# Patient Record
Sex: Female | Born: 1990 | Race: Black or African American | Hispanic: No | Marital: Married | Smoking: Never smoker
Health system: Southern US, Community
[De-identification: ages and names within clinical notes are randomized; demographics above are authoritative.]

## PROBLEM LIST (undated history)

## (undated) DIAGNOSIS — R1011 Right upper quadrant pain: Secondary | ICD-10-CM

## (undated) DIAGNOSIS — I10 Essential (primary) hypertension: Secondary | ICD-10-CM

## (undated) DIAGNOSIS — E282 Polycystic ovarian syndrome: Secondary | ICD-10-CM

## (undated) DIAGNOSIS — F419 Anxiety disorder, unspecified: Secondary | ICD-10-CM

## (undated) DIAGNOSIS — K589 Irritable bowel syndrome without diarrhea: Secondary | ICD-10-CM

## (undated) DIAGNOSIS — K8020 Calculus of gallbladder without cholecystitis without obstruction: Secondary | ICD-10-CM

## (undated) HISTORY — DX: Calculus of gallbladder without cholecystitis without obstruction: K80.20

## (undated) HISTORY — PX: NO PAST SURGERIES: SHX2092

## (undated) HISTORY — DX: Polycystic ovarian syndrome: E28.2

## (undated) HISTORY — DX: Right upper quadrant pain: R10.11

## (undated) HISTORY — DX: Essential (primary) hypertension: I10

---
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
## 2009-03-24 ENCOUNTER — Ambulatory Visit: Payer: Self-pay | Admitting: Family Medicine

## 2009-04-27 ENCOUNTER — Ambulatory Visit: Payer: Self-pay | Admitting: Family Medicine

## 2009-04-27 ENCOUNTER — Encounter (INDEPENDENT_AMBULATORY_CARE_PROVIDER_SITE_OTHER): Payer: Self-pay | Admitting: Internal Medicine

## 2016-10-10 DIAGNOSIS — K589 Irritable bowel syndrome without diarrhea: Secondary | ICD-10-CM

## 2016-10-10 HISTORY — DX: Irritable bowel syndrome, unspecified: K58.9

## 2016-12-23 ENCOUNTER — Encounter: Payer: Self-pay | Admitting: Emergency Medicine

## 2016-12-23 ENCOUNTER — Emergency Department
Admission: EM | Admit: 2016-12-23 | Discharge: 2016-12-23 | Disposition: A | Discharge status: Home / Self Care | Payer: Commercial Managed Care - HMO | Attending: Emergency Medicine | Admitting: Emergency Medicine

## 2016-12-23 ENCOUNTER — Emergency Department: Payer: Commercial Managed Care - HMO

## 2016-12-23 DIAGNOSIS — R1011 Right upper quadrant pain: Secondary | ICD-10-CM | POA: Diagnosis present

## 2016-12-23 DIAGNOSIS — K8020 Calculus of gallbladder without cholecystitis without obstruction: Secondary | ICD-10-CM | POA: Insufficient documentation

## 2016-12-23 DIAGNOSIS — K8080 Other cholelithiasis without obstruction: Secondary | ICD-10-CM

## 2016-12-23 LAB — CBC
HCT: 38.5 % (ref 35.0–47.0)
HEMOGLOBIN: 13.2 g/dL (ref 12.0–16.0)
MCH: 30.1 pg (ref 26.0–34.0)
MCHC: 34.2 g/dL (ref 32.0–36.0)
MCV: 87.8 fL (ref 80.0–100.0)
PLATELETS: 415 10*3/uL (ref 150–440)
RBC: 4.38 MIL/uL (ref 3.80–5.20)
RDW: 14.4 % (ref 11.5–14.5)
WBC: 9.7 10*3/uL (ref 3.6–11.0)

## 2016-12-23 LAB — COMPREHENSIVE METABOLIC PANEL
ALK PHOS: 56 U/L (ref 38–126)
ALT: 26 U/L (ref 14–54)
ANION GAP: 6 (ref 5–15)
AST: 25 U/L (ref 15–41)
Albumin: 4.5 g/dL (ref 3.5–5.0)
BILIRUBIN TOTAL: 0.5 mg/dL (ref 0.3–1.2)
BUN: 16 mg/dL (ref 6–20)
CO2: 28 mmol/L (ref 22–32)
CREATININE: 0.63 mg/dL (ref 0.44–1.00)
Calcium: 9.4 mg/dL (ref 8.9–10.3)
Chloride: 101 mmol/L (ref 101–111)
GFR calc non Af Amer: >60 mL/min (ref >60)
Glucose, Bld: 114 mg/dL — ABNORMAL HIGH (ref 65–99)
Potassium: 3.7 mmol/L (ref 3.5–5.1)
SODIUM: 135 mmol/L (ref 135–145)
Total Protein: 8.4 g/dL — ABNORMAL HIGH (ref 6.5–8.1)

## 2016-12-23 LAB — LIPASE, BLOOD: Lipase: 24 U/L (ref 11–51)

## 2016-12-23 MED ORDER — MORPHINE SULFATE (PF) 2 MG/ML IV SOLN
2.0000 mg | Freq: Once | INTRAVENOUS | Status: AC
  Administered 2016-12-23: 2 mg via INTRAVENOUS
  Filled 2016-12-23: qty 1

## 2016-12-23 MED ORDER — ONDANSETRON HCL 4 MG/2ML IJ SOLN
4.0000 mg | Freq: Once | INTRAMUSCULAR | Status: AC
  Administered 2016-12-23: 4 mg via INTRAVENOUS
  Filled 2016-12-23: qty 2

## 2016-12-23 MED ORDER — OXYCODONE-ACETAMINOPHEN 5-325 MG PO TABS: 1.0000 | ORAL_TABLET | ORAL | 0 refills | Status: DC | PRN

## 2016-12-23 MED ORDER — ONDANSETRON 4 MG PO TBDP: 4.0000 mg | ORAL_TABLET | Freq: Three times a day (TID) | ORAL | 0 refills | Status: DC | PRN

## 2016-12-23 NOTE — ED Notes (Signed)
Pt going to [facility] 

## 2016-12-23 NOTE — ED Triage Notes (Signed)
Pt ambulatory to tx room, reports epigastric pain starting last night but pain has moved more to RUQ.  Reports happened in past and BM helped, but no relief this time.  PT reports nausea as well, denies vomiting and diarrhea.

## 2016-12-23 NOTE — ED Provider Notes (Signed)
[facility] Emergency Department Provider Note    First MD Initiated Contact with Patient 12/23/16 [phone]     (approximate)  I have reviewed the triage vital signs and the nursing notes.   HISTORY  Chief Complaint Abdominal Pain   HPI Briana Barber is a 26 y.o. female presents with acute onset of right upper quadrant abdominal pain associated with nausea tonight. Patient states her current pain score is 8 out of 10. Patient denies any aggravating or alleviating factors. Patient states that she did have an episode yesterday that was similar however resolved following a bowel movement.   Past medical history None There are no active problems to display for this patient.   Surgical history None  Prior to Admission medications   Medication Sig Start Date End Date Taking? Authorizing Provider  ondansetron (ZOFRAN ODT) 4 MG disintegrating tablet Take 1 tablet (4 mg total) by mouth every 8 (eight) hours as needed for nausea or vomiting. 12/23/16   Darci Current, MD  oxyCODONE-acetaminophen (ROXICET) 5-325 MG tablet Take 1 tablet by mouth every 4 (four) hours as needed for severe pain. 12/23/16   Darci Current, MD    Allergies No known drug allergies History reviewed. No pertinent family history.  Social History Social History  Substance Use Topics  . Smoking status: Never Smoker  . Smokeless tobacco: Never Used  . Alcohol use No    Review of Systems Constitutional: No fever/chills Eyes: No visual changes. ENT: No sore throat. Cardiovascular: Denies chest pain. Respiratory: Denies shortness of breath. Gastrointestinal: Positive for abdominal pain and nausea  Genitourinary: Negative for dysuria. Musculoskeletal: Negative for back pain. Skin: Negative for rash. Neurological: Negative for headaches, focal weakness or numbness.  10-point ROS otherwise negative.  ____________________________________________   PHYSICAL EXAM:  VITAL  SIGNS: ED Triage Vitals  Enc Vitals Group     BP 12/23/16 0541 (!) 162/94     Pulse Rate 12/23/16 0541 (!) 125     Resp 12/23/16 0541 18     Temp 12/23/16 0541 98 F (36.7 C)     Temp Source 12/23/16 0541 Oral     SpO2 12/23/16 0541 98 %     Weight 12/23/16 0539 200 lb (90.7 kg)     Height 12/23/16 0539 5\' 4"  (1.626 m)     Head Circumference --      Peak Flow --      Pain Score 12/23/16 0540 7     Pain Loc --      Pain Edu? --      Excl. in GC? --     Constitutional: Alert and oriented. Well appearing and in no acute distress. Eyes: Conjunctivae are normal. PERRL. EOMI. Head: Atraumatic. Mouth/Throat: Mucous membranes are moist. Oropharynx non-erythematous. Neck: No stridor.   Cardiovascular: Normal rate, regular rhythm. Good peripheral circulation. Grossly normal heart sounds. Respiratory: Normal respiratory effort.  No retractions. Lungs CTAB. Gastrointestinal: Right upper quadrant tenderness to palpation. No distention.  Musculoskeletal: No lower extremity tenderness nor edema. No gross deformities of extremities. Neurologic:  Normal speech and language. No gross focal neurologic deficits are appreciated.  Skin:  Skin is warm, dry and intact. No rash noted. Psychiatric: Mood and affect are normal. Speech and behavior are normal.  ____________________________________________   LABS (all labs ordered are listed, but only abnormal results are displayed)  Labs Reviewed  COMPREHENSIVE METABOLIC PANEL - Abnormal; Notable for the following:       Result Value  Glucose, Bld 114 (*)    Total Protein 8.4 (*)    All other components within normal limits  LIPASE, BLOOD  CBC  URINALYSIS, COMPLETE (UACMP) WITH MICROSCOPIC   ____________________________________________  EKG  ED ECG REPORT I, Pella N BROWN, the attending physician, personally viewed and interpreted this ECG.   Date: 12/23/2016  EKG Time: 5:45 AM  Rate: 113  Rhythm: Sinus tachycardia  Axis: Normal   Intervals: Normal  ST&T Change: None  ____________________________________________   RADIOLOGY I, [place] N BROWN, personally viewed and evaluated these images (plain radiographs) as part of my medical decision making, as well as reviewing the written report by the radiologist.  [place] Abdomen Limited Ruq  Result Date: 12/23/2016 CLINICAL DATA:  Right upper quadrant pain. EXAM: US ABDOMEN LIMITED - RIGHT UPPER QUADRANT COMPARISON:  No prior. FINDINGS: Gallbladder: Gallstones measuring up to 1.1 cm noted. Gallbladder wall thickness 1.9 mm. Negative Murphy sign. Common bile duct: Diameter: 2.9 mm Liver: No focal lesion identified. Within normal limits in parenchymal echogenicity. IMPRESSION: Multiple gallstones. No evidence of cholecystitis or biliary distention. Electronically Signed   By: Maisie Fushomas  Register   On: 12/23/2016 07:14     Procedures    INITIAL IMPRESSION / ASSESSMENT AND PLAN / ED COURSE  Pertinent labs & imaging results that were available during my care of the patient were reviewed by me and considered in my medical decision making (see chart for details).  Patient given IV morphine 2 mg and Zofran with complete resolution of pain and nausea. History of physical exam concern for possible cholelithiasis which was confirmed on ultrasound. Patient been referred to Dr. Tonita CongWoodham general surgeon on call. Patient will be prescribed Percocet and Zofran for pain and nausea at home respectively. Patient is advised dental side effects of Percocet and advised not to drive while taking it.      ____________________________________________  FINAL CLINICAL IMPRESSION(S) / ED DIAGNOSES  Final diagnoses:  RUQ pain  Calculus of gallbladder without cholecystitis without obstruction     MEDICATIONS GIVEN DURING THIS VISIT:  Medications  morphine 2 MG/ML injection 2 mg (2 mg Intravenous Given 12/23/16 0611)  ondansetron (ZOFRAN) injection 4 mg (4 mg Intravenous Given 12/23/16 0611)      NEW OUTPATIENT MEDICATIONS STARTED DURING THIS VISIT:  Discharge Medication List as of 12/23/2016  6:58 AM    START taking these medications   Details  ondansetron (ZOFRAN ODT) 4 MG disintegrating tablet Take 1 tablet (4 mg total) by mouth every 8 (eight) hours as needed for nausea or vomiting., Starting Fri 12/23/2016, Print    oxyCODONE-acetaminophen (ROXICET) 5-325 MG tablet Take 1 tablet by mouth every 4 (four) hours as needed for severe pain., Starting Fri 12/23/2016, Print        Discharge Medication List as of 12/23/2016  6:58 AM      Discharge Medication List as of 12/23/2016  6:58 AM       Note:  This document was prepared using Dragon voice recognition software and may include unintentional dictation errors.    Darci Currentandolph N Brown, MD 12/23/16 [phone]

## 2016-12-23 NOTE — ED Notes (Signed)
Pt discharged home after verbalizing understanding of discharge instructions; nad noted. 

## 2016-12-26 ENCOUNTER — Encounter: Payer: Self-pay | Admitting: General Surgery

## 2016-12-26 ENCOUNTER — Ambulatory Visit (INDEPENDENT_AMBULATORY_CARE_PROVIDER_SITE_OTHER): Payer: Commercial Managed Care - HMO | Admitting: General Surgery

## 2016-12-26 VITALS — BP 150/83 | HR 93 | Temp 97.8°F | Ht 64.0 in | Wt 225.2 lb

## 2016-12-26 DIAGNOSIS — K8080 Other cholelithiasis without obstruction: Secondary | ICD-10-CM

## 2016-12-26 NOTE — Progress Notes (Signed)
Patient ID: Briana Barber, female   DOB: 04-25-1991, 26 y.o.   MRN: [id number]  CC: RUQ Pain  HPI Briana Barber is a 26 y.o. female who presents to clinic today for evaluation of abdominal pain. She was in the ER for this last week and was found to have gallstones. Patient reports that she's had approximately 3 bouts of abdominal pain over the last couple weeks. Prior to last Friday the pains occur at night and would be relieved with bowel movements. On Friday the pain was relieved after 1 dose of medication. She is not 100% sure what she ate before the pain however on Friday night it was steak. She had no other symptoms. She denies any fevers, chills, nausea, vomiting, chest pain, shortness breath, diarrhea, constipation. She is otherwise a healthy 26 year old female.  HPI  Past Medical History:  Diagnosis Date  . Cholelithiasis     History reviewed. No pertinent surgical history. Patient reports having never had any surgery before.  Family History  Problem Relation Age of Onset  . Hypertension Mother   . Cancer Maternal Grandmother   . Hypertension Maternal Grandmother   . Heart disease Maternal Grandfather   . Hypertension Maternal Grandfather     Social History Social History  Substance Use Topics  . Smoking status: Never Smoker  . Smokeless tobacco: Never Used  . Alcohol use No    No Known Allergies  Current Outpatient Prescriptions  Medication Sig Dispense Refill  . ondansetron (ZOFRAN ODT) 4 MG disintegrating tablet Take 1 tablet (4 mg total) by mouth every 8 (eight) hours as needed for nausea or vomiting. 20 tablet 0  . oxyCODONE-acetaminophen (ROXICET) 5-325 MG tablet Take 1 tablet by mouth every 4 (four) hours as needed for severe pain. 20 tablet 0   No current facility-administered medications for this visit.      Review of Systems A Multi-point review of systems was asked and was negative except for the findings documented in history of present  illness  Physical Exam Blood pressure (!) 150/83, pulse 93, temperature 97.8 F (36.6 C), temperature source Oral, height 5\' 4"  (1.626 m), weight 102.2 kg (225 lb 3.2 oz), last menstrual period 11/30/2016. CONSTITUTIONAL: No acute distress. EYES: Pupils are equal, round, and reactive to light, Sclera are non-icteric. EARS, NOSE, MOUTH AND THROAT: The oropharynx is clear. The oral mucosa is pink and moist. Hearing is intact to voice. LYMPH NODES:  Lymph nodes in the neck are normal. RESPIRATORY:  Lungs are clear. There is normal respiratory effort, with equal breath sounds bilaterally, and without pathologic use of accessory muscles. CARDIOVASCULAR: Heart is regular without murmurs, gallops, or rubs. GI: The abdomen is soft, nontender, and nondistended. There are no palpable masses. There is no hepatosplenomegaly. There are normal bowel sounds in all quadrants. GU: Rectal deferred.   MUSCULOSKELETAL: Normal muscle strength and tone. No cyanosis or edema.   SKIN: Turgor is good and there are no pathologic skin lesions or ulcers. NEUROLOGIC: Motor and sensation is grossly normal. Cranial nerves are grossly intact. PSYCH:  Oriented to person, place and time. Affect is normal.  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 Gallbladder ultrasound showed a 1.1 cm gallstone but normal gallbladder wall thickness, normal common bile duct diameter, no pericholecystic fluid. I have personally reviewed the patient's imaging, laboratory findings and medical records.  Assessment    Abdominal pain    Plan    26 year old female with recent history of abdominal pain and cholelithiasis. Based on her history it is unclear whether or not her abdominal pain is truly related to symptomatic cholelithiasis or not. Discussed in detail the signs and symptoms of acute cholecystitis and  to report to the emergency room should they occur. Otherwise discussed keeping a log of when she has abdominal pain and what she ate prior to the pain. This way we could better characterize whether or not the pain is of biliary origin. She will follow up in 3 weeks to go over the log. If she has no pain between now and then discussed would take a watchful waiting approach to whether not she needs further evaluation. Should the pain appeared to not be of biliary origin discussed she would get a gastroenterology referral instead. All questions answered to the patient and the patient's mother's satisfaction.     Time spent with the patient was 45 minutes, with more than 50% of the time spent in face-to-face education, counseling and care coordination.     Ricarda Frame, MD FACS General Surgeon 12/26/2016, 10:44 AM

## 2016-12-26 NOTE — Patient Instructions (Signed)
We would like for you to keep a diary of the foods you eat and if there is any pain associated with what you ate. Please see your follow up appointment listed below.    Low-Fat Diet for Pancreatitis or Gallbladder Conditions A low-fat diet can be helpful if you have pancreatitis or a gallbladder condition. With these conditions, your pancreas and gallbladder have trouble digesting fats. A healthy eating plan with less fat will help rest your pancreas and gallbladder and reduce your symptoms. What do I need to know about this diet?  Eat a low-fat diet.  Reduce your fat intake to less than 20-30% of your total daily calories. This is less than 50-60 g of fat per day.  Remember that you need some fat in your diet. Ask your dietician what your daily goal should be.  Choose nonfat and low-fat healthy foods. Look for the words "nonfat," "low fat," or "fat free."  As a guide, look on the label and choose foods with less than 3 g of fat per serving. Eat only one serving.  Avoid alcohol.  Do not smoke. If you need help quitting, talk with your health care provider.  Eat small frequent meals instead of three large heavy meals. What foods can I eat? Grains  Include healthy grains and starches such as potatoes, wheat bread, fiber-rich cereal, and brown rice. Choose whole grain options whenever possible. In adults, whole grains should account for 45-65% of your daily calories. Fruits and Vegetables  Eat plenty of fruits and vegetables. Fresh fruits and vegetables add fiber to your diet. Meats and Other Protein Sources  Eat lean meat such as chicken and pork. Trim any fat off of meat before cooking it. Eggs, fish, and beans are other sources of protein. In adults, these foods should account for 10-35% of your daily calories. Dairy  Choose low-fat milk and dairy options. Dairy includes fat and protein, as well as calcium. Fats and Oils  Limit high-fat foods such as fried foods, sweets, baked goods,  sugary drinks. Other  Creamy sauces and condiments, such as mayonnaise, can add extra fat. Think about whether or not you need to use them, or use smaller amounts or low fat options. What foods are not recommended?  High fat foods, such as:  [employer]aked goods.  Ice cream.  [place] toast.  Sweet rolls.  Pizza.  Cheese bread.  Foods covered with batter, butter, creamy sauces, or cheese.  Fried foods.  Sugary drinks and desserts.  Foods that cause gas or bloating This information is not intended to replace advice given to you by your health care provider. Make sure you discuss any questions you have with your health care provider. Document Released: 10/01/2013 Document Revised: 03/03/2016 Document Reviewed: 09/09/2013 Elsevier Interactive Patient Education  2017 [employer]

## 2017-01-18 ENCOUNTER — Encounter: Payer: Self-pay | Admitting: General Surgery

## 2017-01-18 ENCOUNTER — Ambulatory Visit (INDEPENDENT_AMBULATORY_CARE_PROVIDER_SITE_OTHER): Payer: Commercial Managed Care - HMO | Admitting: General Surgery

## 2017-01-18 ENCOUNTER — Telehealth: Payer: Self-pay

## 2017-01-18 VITALS — BP 148/97 | HR 106 | Temp 98.4°F | Ht 64.0 in | Wt 227.0 lb

## 2017-01-18 DIAGNOSIS — R1011 Right upper quadrant pain: Secondary | ICD-10-CM | POA: Insufficient documentation

## 2017-01-18 HISTORY — DX: Right upper quadrant pain: R10.11

## 2017-01-18 NOTE — Patient Instructions (Signed)
We have referred you an appointment to [facility].  They will contact you with an appointment.  If you do not hear from them with in the next week please give our office a call.

## 2017-01-18 NOTE — Telephone Encounter (Signed)
I have put in an internal referral to [facility].   I will follow up within 3-5 days to make sure the appointments have been scheduled.   

## 2017-01-18 NOTE — Progress Notes (Signed)
Outpatient Surgical Follow Up  01/18/2017  Briana Barber is an 26 y.o. female.   Chief Complaint  Patient presents with  . Follow-up    Cholelithiasis (Pt. was to keep food diary)    HPI: 26 year old female returns to clinic for follow-up. She was recently evaluated for abdominal pain after an ultrasound showed cholelithiasis. Patient kept a food log since her last visit. She's had 2 bouts of abdominal fullness in the right upper quadrant since she was last seen. They do not appear to be related to what she ate. She's had numerous log entries of diet so should've caused gallbladder function that did not cause any discomfort. She has had some bouts of nausea since her last visit but no vomiting. She denies any other changes in her health. She denies any fevers, chills, vomiting, diarrhea, constipation.  Past Medical History:  Diagnosis Date  . Cholelithiasis     History reviewed. No pertinent surgical history.  Family History  Problem Relation Age of Onset  . Hypertension Mother   . Cancer Maternal Grandmother   . Hypertension Maternal Grandmother   . Heart disease Maternal Grandfather   . Hypertension Maternal Grandfather     Social History:  reports that she has never smoked. She has never used smokeless tobacco. She reports that she does not drink alcohol or use drugs.  Allergies: No Known Allergies  Medications reviewed.    ROS A multipoint review of systems was completed, all pertinent positives and negatives are documented within the history of present illness the remainder are negative   BP (!) 148/97   Pulse (!) 106   Temp 98.4 F (36.9 C) (Oral)   Ht  (1.626 m)   Wt 103 kg (227 lb)   BMI 38.96 kg/m   Physical Exam Gen.: No acute distress Neck: Supple and nontender Chest: Clear to auscultation Heart: Regular rate and rhythm Abdomen: Soft, nontender, nondistended Extremities: Moves all extremities.    No results found for this or any previous  visit (from the past 48 hour(s)). No results found.  Assessment/Plan:  1. Right upper quadrant abdominal pain 26 year old female with gallstones and intermittent abdominal pains. Her history is not consistent with biliary colic. Discussed with the patient that given her findings and complaints that she warrants a gastroenterology workup. Should no other cause for abdominal pain be identified could proceed with a HIDA scan to prove whether or not it is for gallbladder. All questions answered to patient's satisfaction. Plan for outpatient referral to gastroenterology. Follow-up with surgery on an as-needed basis.  A total of 15 minutes was used on this encounter with greater than 50% of it used for counseling and coordination of care.     Ricarda Frame, MD FACS General Surgeon  01/18/2017,2:40 PM

## 2017-01-25 NOTE — Telephone Encounter (Signed)
An appointment has been scheduled with Dr. Tobi Bastos for 02/16/17 at 2:15 PM

## 2017-02-16 ENCOUNTER — Encounter: Payer: Self-pay | Admitting: Gastroenterology

## 2017-02-16 ENCOUNTER — Encounter (INDEPENDENT_AMBULATORY_CARE_PROVIDER_SITE_OTHER): Payer: Self-pay

## 2017-02-16 ENCOUNTER — Ambulatory Visit (INDEPENDENT_AMBULATORY_CARE_PROVIDER_SITE_OTHER): Payer: Commercial Managed Care - HMO | Admitting: Gastroenterology

## 2017-02-16 VITALS — BP 126/81 | HR 121 | Temp 97.8°F | Ht 64.0 in | Wt 223.2 lb

## 2017-02-16 DIAGNOSIS — R1011 Right upper quadrant pain: Secondary | ICD-10-CM

## 2017-02-16 MED ORDER — DICYCLOMINE HCL 20 MG PO TABS: 20.0000 mg | ORAL_TABLET | Freq: Four times a day (QID) | ORAL | 0 refills | Status: DC | PRN

## 2017-02-16 MED ORDER — OMEPRAZOLE 40 MG PO CPDR: 40.0000 mg | DELAYED_RELEASE_CAPSULE | Freq: Every day | ORAL | 3 refills | Status: DC

## 2017-02-16 NOTE — Progress Notes (Signed)
Gastroenterology Consultation  Referring Provider:     Ricarda FrameWoodham, Charles, MD Primary Care Physician:  Vic RipperBean, Billie-Lynn, PA-C (Inactive) Primary Gastroenterologist:  Dr. Wyline MoodKiran Harlee Eckroth  Reason for Consultation:     Abdominal pain         HPI:   Briana Barber is a 26 y.o. y/o female referred for consultation & management  by Dr. Jillyn HiddenBean, Billie-Lynn, PA-C (Inactive).    She was recently seen by Dr Tonita CongWoodham for RUQ pain , he felt her pain was not consistent with biliary colic and referred her to GI . RUQ USG showed multiple gall stones. She had initially presented to the ER on 12/23/16  Labs 12/23/16- LFT's normal   Abdominal pain: Onset: First began months back , few weeks prior to ER since , since ER visit has not had any episodes as severe as her ER visit.No frequency clearly described - 4 episodes so far , each episode lasted from 1-4 hours   Site :Rt side of her abdomen , at one point was all over her abdomen  Radiation: localized  Severity :"can be very severe but not always" [name] of pain: dull in nature - intense  Aggravating factors: cant lay on her stomach - usually happens at night when she sleeps on her stomach , not worse after she eats  Relieving factors :changing position  Weight loss: no  NSAID use: no  PPI use :none  Gall bladder surgery: none Frequency of bowel movements: usually daily - soft  Change in bowel movements:  No  Relief with bowel movements: it has in the past  Gas/Bloating/Abdominal distension: not usually   Does have a different pain during her periods. . Sexually active and no pain during intercourse.   No family history of gall stones .   Past Medical History:  Diagnosis Date  . Cholelithiasis     History reviewed. No pertinent surgical history.  Prior to Admission medications   Medication Sig Start Date End Date Taking? Authorizing Provider  ondansetron (ZOFRAN ODT) 4 MG disintegrating tablet Take 1 tablet (4 mg total) by mouth every 8 (eight)  hours as needed for nausea or vomiting. 12/23/16  Yes Darci CurrentBrown, [place] N, MD  oxyCODONE-acetaminophen (ROXICET) 5-325 MG tablet Take 1 tablet by mouth every 4 (four) hours as needed for severe pain. 12/23/16  Yes Darci CurrentBrown, [place] N, MD    Family History  Problem Relation Age of Onset  . Hypertension Mother   . Cancer Maternal Grandmother   . Hypertension Maternal Grandmother   . Colon cancer Maternal Grandmother   . Heart disease Maternal Grandfather   . Hypertension Maternal Grandfather      Social History  Substance Use Topics  . Smoking status: Never Smoker  . Smokeless tobacco: Never Used  . Alcohol use No    Allergies as of 02/16/2017  . (No Known Allergies)    Review of Systems:    All systems reviewed and negative except where noted in HPI.   Physical Exam:  BP 126/81 (BP Location: Left Arm, Patient Position: Sitting, Cuff Size: Large)   Pulse (!) 121   Temp 97.8 F (36.6 C) (Oral)   Ht 5\' 4"  (1.626 m)   Wt 223 lb 3.2 oz (101.2 kg)   BMI 38.31 kg/m  No LMP recorded. Psych:  Alert and cooperative. Normal mood and affect. General:   Alert,  Well-developed, well-nourished, pleasant and cooperative in NAD Head:  Normocephalic and atraumatic. Eyes:  Sclera clear, no icterus.   Conjunctiva  pink. Ears:  Normal auditory acuity. Nose:  No deformity, discharge, or lesions. Mouth:  No deformity or lesions,oropharynx pink & moist. Neck:  Supple; no masses or thyromegaly. Lungs:  Respirations even and unlabored.  Clear throughout to auscultation.   No wheezes, crackles, or rhonchi. No acute distress. Heart:  Regular rate and rhythm; no murmurs, clicks, rubs, or gallops. Abdomen:  Normal bowel sounds.  No bruits.  Soft, non-tender and non-distended without masses, hepatosplenomegaly or hernias noted.  No guarding or rebound tenderness.    Msk:  Symmetrical without gross deformities. Good, equal movement & strength bilaterally. Pulses:  Normal pulses noted. Extremities:  No  clubbing or edema.  No cyanosis. Neurologic:  Alert and oriented x3;  grossly normal neurologically. Psych:  Alert and cooperative. Normal mood and affect.  Imaging Studies: No results found.  Assessment and Plan:   ORIEL OJO is a 26 y.o. y/o female has been referred for abdominal pain. Her description of the pain does not sound biliary in nature and I do not recommend a cholecystectomy at this time. She has some features of IBS with the pain better after a bowel movement and some features of musculoskeletal as it is better with posture   Plan  1. PPI 2. Bentyl 20 mg QID PRN 3. H pylori stool antigen  4. At next visit if no better will then consider an EGD.   Follow up in 2 months   Dr Wyline Mood MD

## 2017-02-23 ENCOUNTER — Other Ambulatory Visit
Admission: RE | Admit: 2017-02-23 | Discharge: 2017-02-23 | Disposition: A | Discharge status: Home / Self Care | Payer: Commercial Managed Care - HMO | Source: Ambulatory Visit | Attending: Gastroenterology | Admitting: Gastroenterology

## 2017-02-23 DIAGNOSIS — R1011 Right upper quadrant pain: Secondary | ICD-10-CM | POA: Insufficient documentation

## 2017-02-25 LAB — H. PYLORI ANTIGEN, STOOL: H. PYLORI STOOL AG, EIA: NEGATIVE

## 2017-02-27 ENCOUNTER — Telehealth: Payer: Self-pay

## 2017-02-27 NOTE — Telephone Encounter (Signed)
LVM for patient callback for results per Dr. Anna.    - H pylori stool antigen negative  

## 2017-02-27 NOTE — Telephone Encounter (Signed)
-----   Message from Kiran Anna, MD sent at 02/27/2017 11:43 AM EDT ----- H pylori stool antigen negative 

## 2017-04-18 ENCOUNTER — Ambulatory Visit (INDEPENDENT_AMBULATORY_CARE_PROVIDER_SITE_OTHER): Payer: Commercial Managed Care - HMO | Admitting: Gastroenterology

## 2017-04-18 ENCOUNTER — Encounter: Payer: Self-pay | Admitting: Gastroenterology

## 2017-04-18 VITALS — BP 141/85 | HR 102 | Temp 98.3°F | Ht 64.0 in | Wt 227.4 lb

## 2017-04-18 DIAGNOSIS — K581 Irritable bowel syndrome with constipation: Secondary | ICD-10-CM

## 2017-04-18 NOTE — Progress Notes (Signed)
   Wyline MoodKiran Horrace Hanak MD, MRCP(U.K) [address]  [address]  [place], [place] [address]  Main: [phone]  Fax: [phone]   Primary Care Physician: Vic RipperBean, Billie-Lynn, PA-C (Inactive)  Primary Gastroenterologist:  Dr. Wyline MoodKiran Ruven Corradi   No chief complaint on file.   HPI: Briana Barber is a 26 y.o. female    She is here for follow up for RUQ pain .   Summary of history :  She was recently seen by Dr Tonita CongWoodham for RUQ pain , he felt her pain was not consistent with biliary colic and referred her to GI . RUQ USG showed multiple gall stones. She had initially presented to the ER on 12/23/16  Labs 12/23/16- LFT's normal . The abdominal pain began a few months back, localized , dull in nature , usually at night when she sleeps, some relief after bowel movements.   Interval history   02/16/2017-  04/18/2017   Taking bently and says she is doing really well since. Not lost any weight . Normal bowel movements although she has had some constipation occasionally.   Current Outpatient Prescriptions  Medication Sig Dispense Refill  . dicyclomine (BENTYL) 20 MG tablet Take 1 tablet (20 mg total) by mouth 4 (four) times daily as needed for spasms. 60 tablet 0  . omeprazole (PRILOSEC) 40 MG capsule Take 1 capsule (40 mg total) by mouth daily. 90 capsule 3  . ondansetron (ZOFRAN ODT) 4 MG disintegrating tablet Take 1 tablet (4 mg total) by mouth every 8 (eight) hours as needed for nausea or vomiting. 20 tablet 0  . oxyCODONE-acetaminophen (ROXICET) 5-325 MG tablet Take 1 tablet by mouth every 4 (four) hours as needed for severe pain. 20 tablet 0   No current facility-administered medications for this visit.     Allergies as of 04/18/2017  . (No Known Allergies)    ROS:  General: Negative for anorexia, weight loss, fever, chills, fatigue, weakness. ENT: Negative for hoarseness, difficulty swallowing , nasal congestion. CV: Negative for chest pain, angina, palpitations, dyspnea on exertion,  peripheral edema.  Respiratory: Negative for dyspnea at rest, dyspnea on exertion, cough, sputum, wheezing.  GI: See history of present illness. GU:  Negative for dysuria, hematuria, urinary incontinence, urinary frequency, nocturnal urination.  Endo: Negative for unusual weight change.    Physical Examination:   There were no vitals taken for this visit.  General: Well-nourished, well-developed in no acute distress.  Eyes: No icterus. Conjunctivae pink. Mouth: Oropharyngeal mucosa moist and pink , no lesions erythema or exudate. Lungs: Clear to auscultation bilaterally. Non-labored. Heart: Regular rate and rhythm, no murmurs rubs or gallops.  Abdomen: Bowel sounds are normal, nontender, nondistended, no hepatosplenomegaly or masses, no abdominal bruits or hernia , no rebound or guarding.   Extremities: No lower extremity edema. No clubbing or deformities. Neuro: Alert and oriented x 3.  Grossly intact. Skin: Warm and dry, no jaundice.   Psych: Alert and cooperative, normal mood and affect.   Imaging Studies: No results found.  Assessment and Plan:   Briana Barber is a 26 y.o. y/o female here to follow up for abdominal pain. Her description of the pain and relief after taking bentyl highly suggestive of IBS. No red flag signs. Since she is doing well , advised her to consume a diet high in fiberand return as needed  .  Dr Wyline MoodKiran Latara Micheli  MD,MRCP [facility](U.K) Follow up  As needed

## 2017-04-23 IMAGING — US US ABDOMEN LIMITED
1 series · 14 of 25 positions shown · non-contrast
Comparison: No prior.

CLINICAL DATA: Right upper quadrant pain.

EXAM:
US ABDOMEN LIMITED - RIGHT UPPER QUADRANT

[Series 1: us abdomen limited · 0.19mm/px · 14 of 47 slices shown]
[im 1/47]
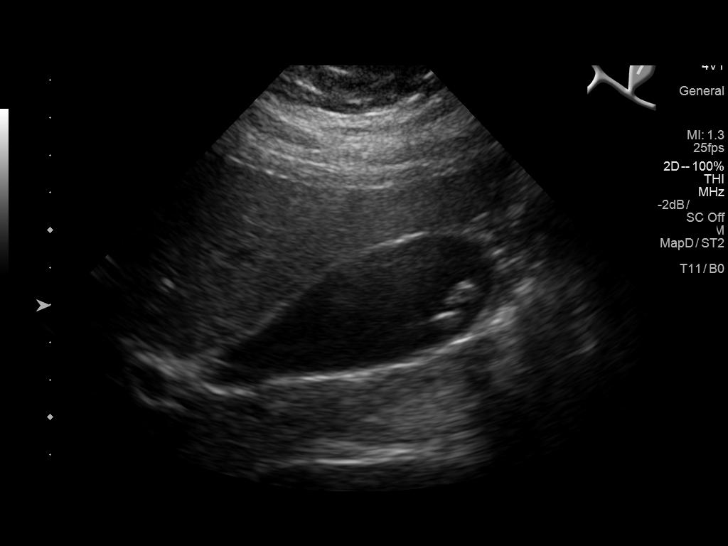
[im 4/47]
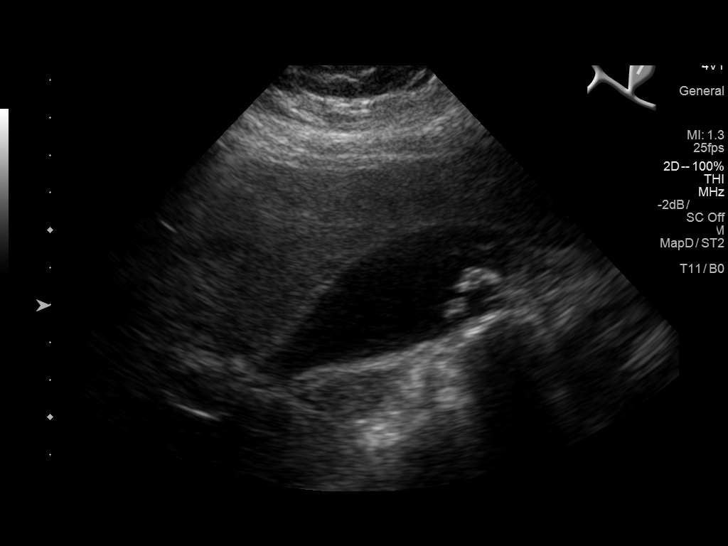
[im 8/47]
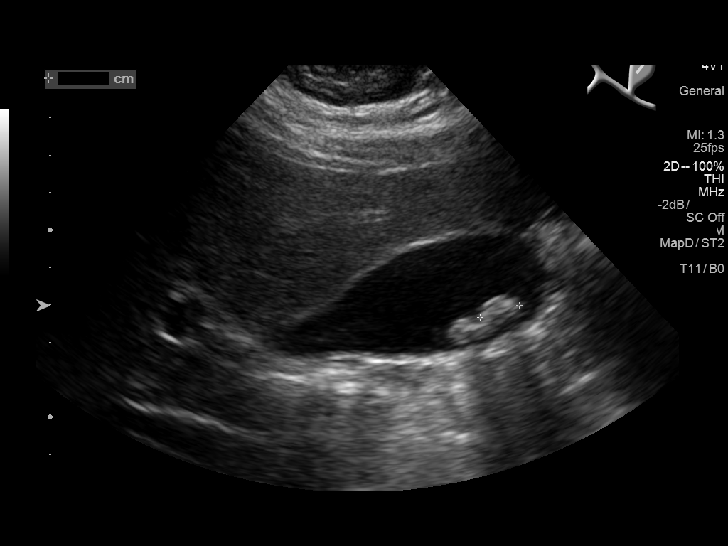
[im 12/47]
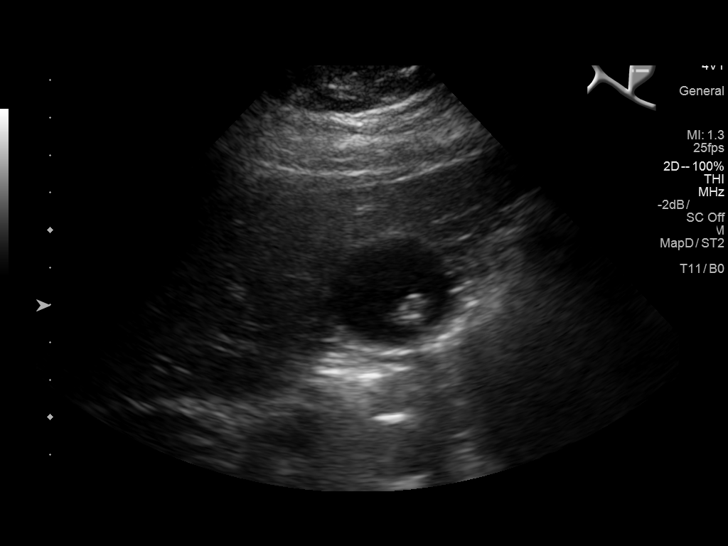
[im 16/47]
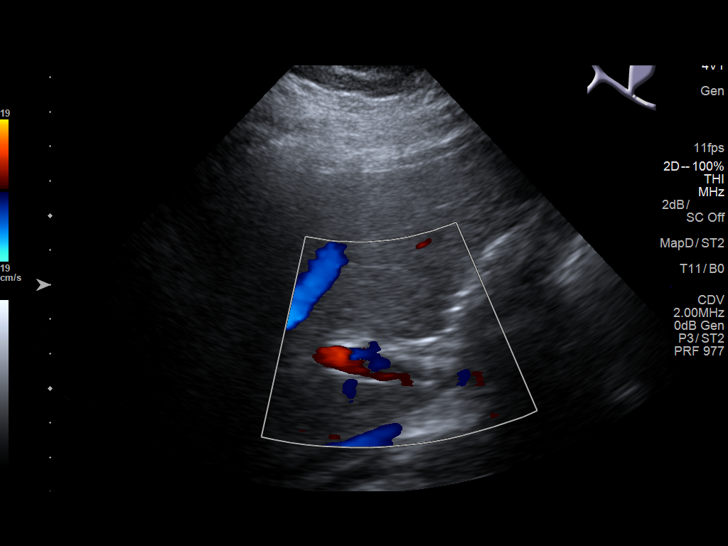
[im 18/47]
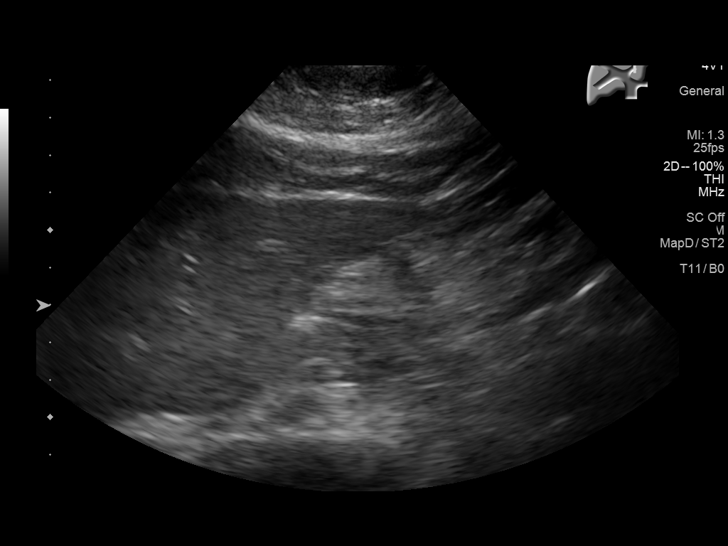
[im 22/47]
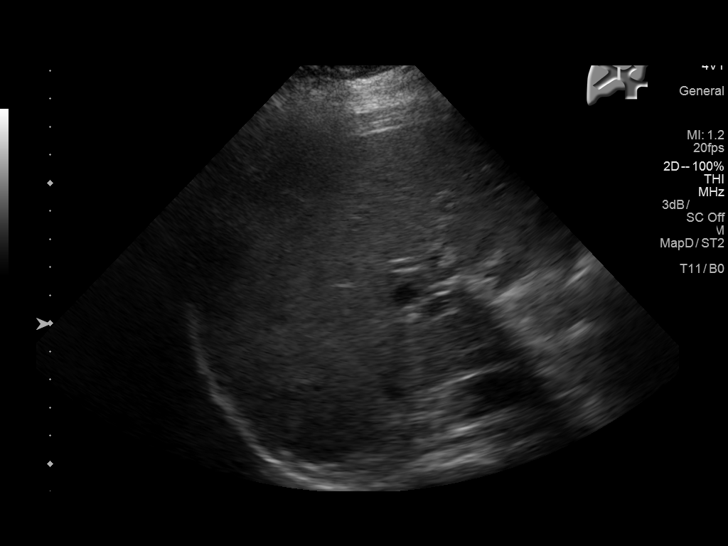
[im 25/47]
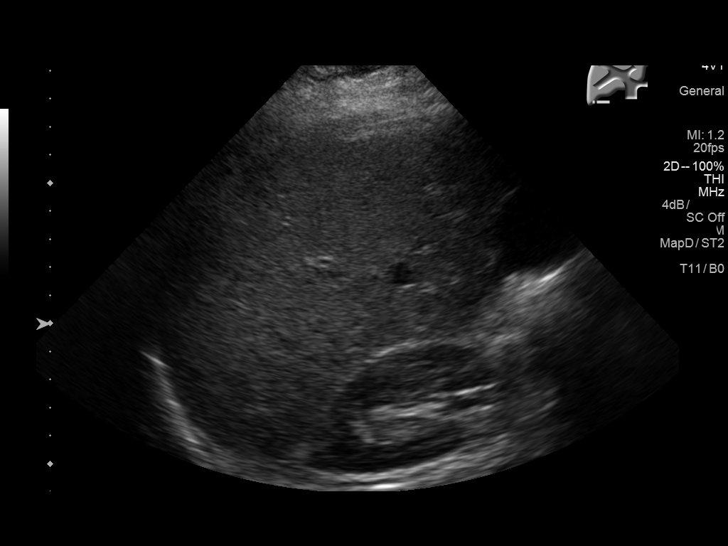
[im 29/47]
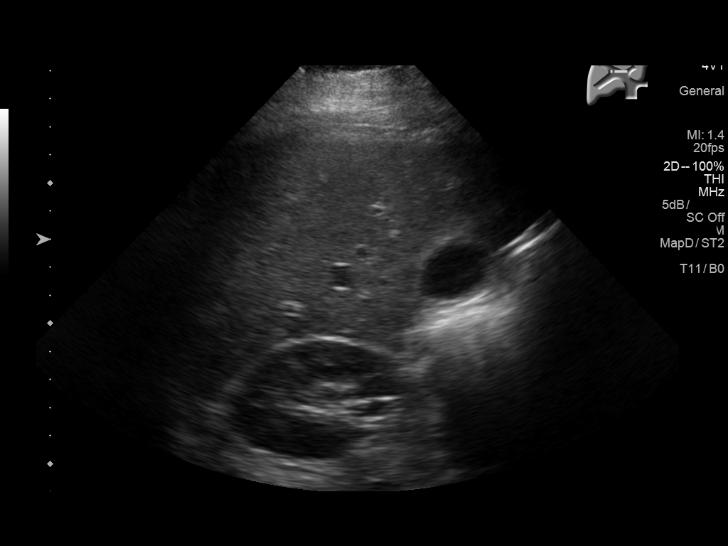
[im 31/47]
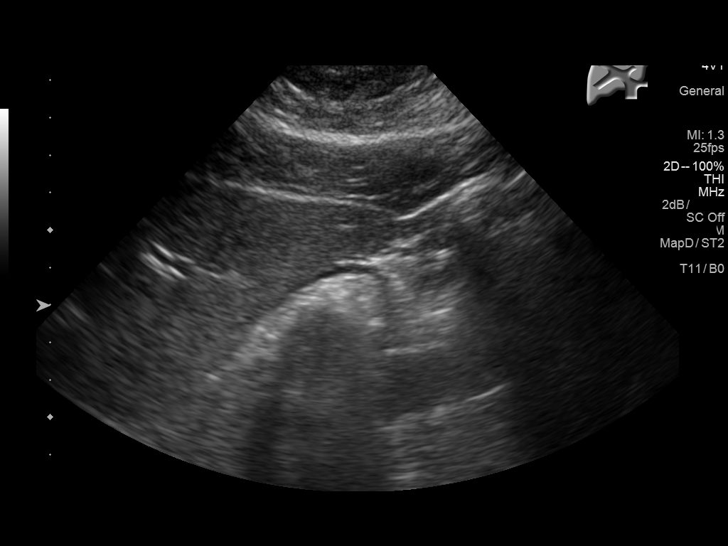
[im 35/47]
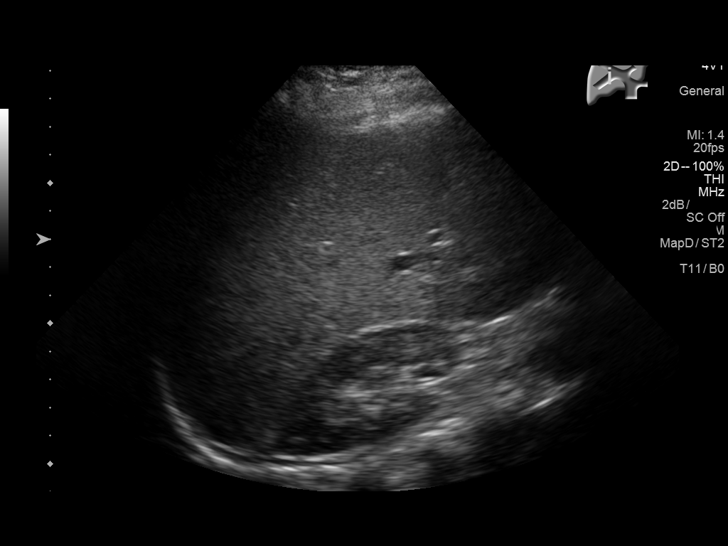
[im 39/47]
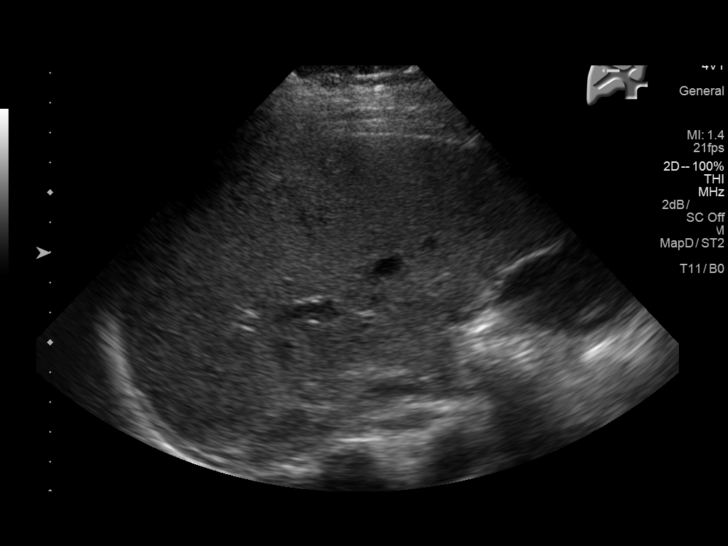
[im 43/47]
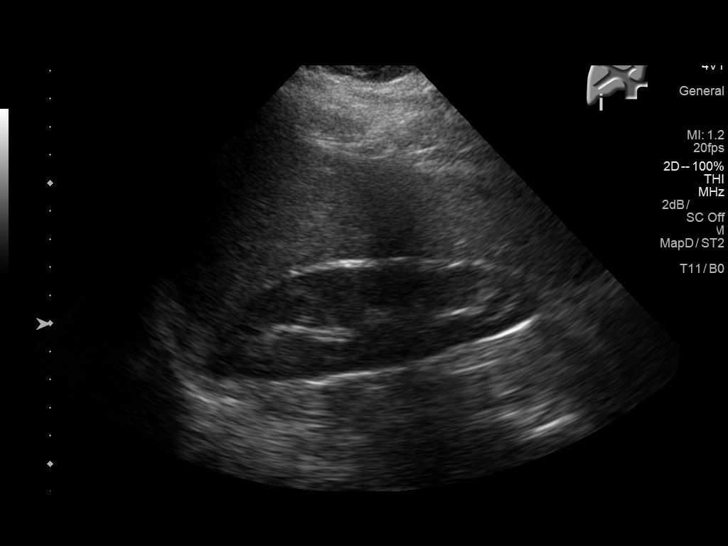
[im 47/47]
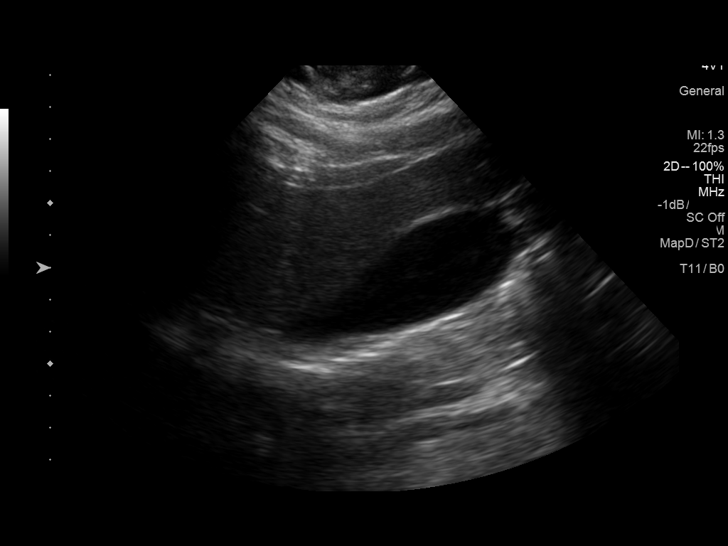

[14 of 25 positions shown; findings below may reference images not displayed]

FINDINGS: Gallbladder:

Gallstones measuring up to 1.1 cm noted. Gallbladder wall thickness
1.9 mm. Negative Murphy sign.

Common bile duct:

Diameter: 2.9 mm

Liver:

No focal lesion identified. Within normal limits in parenchymal
echogenicity.
IMPRESSION: Multiple gallstones. No evidence of cholecystitis or biliary
distention.

## 2017-06-07 ENCOUNTER — Telehealth: Payer: Self-pay | Admitting: Gastroenterology

## 2017-06-07 ENCOUNTER — Other Ambulatory Visit: Payer: Self-pay

## 2017-06-07 MED ORDER — OMEPRAZOLE 40 MG PO CPDR: 40.0000 mg | DELAYED_RELEASE_CAPSULE | Freq: Every day | ORAL | 1 refills | Status: DC

## 2017-06-07 NOTE — Telephone Encounter (Signed)
Patient called to get a refill on Omeprazole.  [facility] [address]

## 2017-06-09 ENCOUNTER — Emergency Department: Payer: 59

## 2017-06-09 ENCOUNTER — Telehealth: Payer: Self-pay

## 2017-06-09 ENCOUNTER — Emergency Department
Admission: EM | Admit: 2017-06-09 | Discharge: 2017-06-09 | Disposition: A | Discharge status: Home / Self Care | Payer: 59 | Attending: Emergency Medicine | Admitting: Emergency Medicine

## 2017-06-09 ENCOUNTER — Encounter: Payer: Self-pay | Admitting: Surgery

## 2017-06-09 DIAGNOSIS — K8000 Calculus of gallbladder with acute cholecystitis without obstruction: Secondary | ICD-10-CM | POA: Diagnosis not present

## 2017-06-09 DIAGNOSIS — R1110 Vomiting, unspecified: Secondary | ICD-10-CM | POA: Diagnosis not present

## 2017-06-09 DIAGNOSIS — R1011 Right upper quadrant pain: Secondary | ICD-10-CM | POA: Insufficient documentation

## 2017-06-09 DIAGNOSIS — R109 Unspecified abdominal pain: Secondary | ICD-10-CM | POA: Diagnosis present

## 2017-06-09 HISTORY — DX: Irritable bowel syndrome without diarrhea: K58.9

## 2017-06-09 LAB — COMPREHENSIVE METABOLIC PANEL
ALK PHOS: 50 U/L (ref 38–126)
ALT: 17 U/L (ref 14–54)
ANION GAP: 7 (ref 5–15)
AST: 18 U/L (ref 15–41)
Albumin: 3.8 g/dL (ref 3.5–5.0)
BILIRUBIN TOTAL: 0.2 mg/dL — AB (ref 0.3–1.2)
BUN: 15 mg/dL (ref 6–20)
CALCIUM: 8.5 mg/dL — AB (ref 8.9–10.3)
CO2: 27 mmol/L (ref 22–32)
Chloride: 103 mmol/L (ref 101–111)
Creatinine, Ser: 0.75 mg/dL (ref 0.44–1.00)
GFR calc non Af Amer: >60 mL/min (ref >60)
Glucose, Bld: 127 mg/dL — ABNORMAL HIGH (ref 65–99)
Potassium: 3.4 mmol/L — ABNORMAL LOW (ref 3.5–5.1)
SODIUM: 137 mmol/L (ref 135–145)
TOTAL PROTEIN: 7.4 g/dL (ref 6.5–8.1)

## 2017-06-09 LAB — CBC
HCT: 37.1 % (ref 35.0–47.0)
HEMOGLOBIN: 12.6 g/dL (ref 12.0–16.0)
MCH: 29.4 pg (ref 26.0–34.0)
MCHC: 33.9 g/dL (ref 32.0–36.0)
MCV: 86.6 fL (ref 80.0–100.0)
Platelets: 378 10*3/uL (ref 150–440)
RBC: 4.29 MIL/uL (ref 3.80–5.20)
RDW: 14.2 % (ref 11.5–14.5)
WBC: 8.1 10*3/uL (ref 3.6–11.0)

## 2017-06-09 LAB — URINALYSIS, COMPLETE (UACMP) WITH MICROSCOPIC
BILIRUBIN URINE: NEGATIVE
Bacteria, UA: NONE SEEN
Glucose, UA: NEGATIVE mg/dL
KETONES UR: NEGATIVE mg/dL
Leukocytes, UA: NEGATIVE
Nitrite: NEGATIVE
PROTEIN: NEGATIVE mg/dL
SPECIFIC GRAVITY, URINE: 1.029 (ref 1.005–1.030)
pH: 6 (ref 5.0–8.0)

## 2017-06-09 LAB — POCT PREGNANCY, URINE: Preg Test, Ur: NEGATIVE

## 2017-06-09 LAB — LIPASE, BLOOD: Lipase: 34 U/L (ref 11–51)

## 2017-06-09 LAB — TROPONIN I

## 2017-06-09 MED ORDER — MORPHINE SULFATE (PF) 2 MG/ML IV SOLN
2.0000 mg | Freq: Once | INTRAVENOUS | Status: AC
  Administered 2017-06-09: 2 mg via INTRAVENOUS
  Filled 2017-06-09: qty 1

## 2017-06-09 MED ORDER — MORPHINE SULFATE (PF) 4 MG/ML IV SOLN: 4.0000 mg | Freq: Once | INTRAVENOUS | Status: DC

## 2017-06-09 MED ORDER — ONDANSETRON HCL 4 MG/2ML IJ SOLN
4.0000 mg | Freq: Once | INTRAMUSCULAR | Status: AC
  Administered 2017-06-09: 4 mg via INTRAVENOUS
  Filled 2017-06-09: qty 2

## 2017-06-09 MED ORDER — FENTANYL CITRATE (PF) 100 MCG/2ML IJ SOLN
50.0000 ug | Freq: Once | INTRAMUSCULAR | Status: AC
  Administered 2017-06-09: 50 ug via INTRAVENOUS
  Filled 2017-06-09: qty 2

## 2017-06-09 MED ORDER — KETOROLAC TROMETHAMINE 30 MG/ML IJ SOLN
30.0000 mg | Freq: Once | INTRAMUSCULAR | Status: AC
  Administered 2017-06-09: 30 mg via INTRAVENOUS
  Filled 2017-06-09: qty 1

## 2017-06-09 NOTE — Telephone Encounter (Signed)
Please call patient and make her aware of ED Follow-up appointment with Dr Earlene Plateravis.

## 2017-06-09 NOTE — ED Provider Notes (Signed)
[facility] Emergency Department Provider Note   First MD Initiated Contact with Patient 06/09/17 [phone]     (approximate)  I have reviewed the triage vital signs and the nursing notes.   HISTORY  Chief Complaint Abdominal Pain    HPI DIM MEISINGER is a 26 y.o. female with history of gallstones diagnosed in March 2018 presents to the emergency department with acute onset of upper abdominal pain times one day acute worsening tonight which is currently 9 out of 10. Patient admits to nausea however no vomiting. Patient denies any constipation or diarrhea. Patient denies any fever or febrile on presentation with temperature 98.4. Of note patient states that she followed up with Dr. Tonita Cong general surgeon regarding gallstones however gallbladder has not been removed. Patient also states that she saw a gastroenterologist who prescribed her medications for an ulcer.   Past Medical History:  Diagnosis Date  . Cholelithiasis     Patient Active Problem List   Diagnosis Date Noted  . Right upper quadrant abdominal pain 01/18/2017  . Cholelithiasis     No past surgical history on file.  Prior to Admission medications   Medication Sig Start Date End Date Taking? Authorizing Provider  dicyclomine (BENTYL) 20 MG tablet Take 1 tablet (20 mg total) by mouth 4 (four) times daily as needed for spasms. 02/16/17 04/18/17  Wyline Mood, MD  omeprazole (PRILOSEC) 40 MG capsule Take 1 capsule (40 mg total) by mouth daily. 06/07/17   Wyline Mood, MD  ondansetron (ZOFRAN ODT) 4 MG disintegrating tablet Take 1 tablet (4 mg total) by mouth every 8 (eight) hours as needed for nausea or vomiting. 12/23/16   Darci Current, MD  oxyCODONE-acetaminophen (ROXICET) 5-325 MG tablet Take 1 tablet by mouth every 4 (four) hours as needed for severe pain. 12/23/16   Darci Current, MD    Allergies No known drug allergies  Family History  Problem Relation Age of Onset  . Hypertension  Mother   . Cancer Maternal Grandmother   . Hypertension Maternal Grandmother   . Colon cancer Maternal Grandmother   . Heart disease Maternal Grandfather   . Hypertension Maternal Grandfather     Social History Social History  Substance Use Topics  . Smoking status: Never Smoker  . Smokeless tobacco: Never Used  . Alcohol use No    Review of Systems Constitutional: No fever/chills Eyes: No visual changes. ENT: No sore throat. Cardiovascular: Denies chest pain. Respiratory: Denies shortness of breath. Gastrointestinal:positive for abdominal pain and nausea Genitourinary: Negative for dysuria. Musculoskeletal: Negative for neck pain.  Negative for back pain. Integumentary: Negative for rash. Neurological: Negative for headaches, focal weakness or numbness.  ____________________________________________   PHYSICAL EXAM:  VITAL SIGNS: ED Triage Vitals  Enc Vitals Group     BP 06/09/17 0403 137/89     Pulse Rate 06/09/17 0403 85     Resp 06/09/17 0403 16     Temp 06/09/17 0403 98.4 F (36.9 C)     Temp Source 06/09/17 0403 Oral     SpO2 06/09/17 0403 100 %     Weight --      Height --      Head Circumference --      Peak Flow --      Pain Score 06/09/17 0405 9     Pain Loc --      Pain Edu? --      Excl. in GC? --     Constitutional: Alert and  oriented. Well appearing and in no acute distress. Eyes: Conjunctivae are normal.  Head: Atraumatic. Mouth/Throat: Mucous membranes are moist.  Oropharynx non-erythematous. Neck: No stridor.   Cardiovascular: Normal rate, regular rhythm. Good peripheral circulation. Grossly normal heart sounds. Respiratory: Normal respiratory effort.  No retractions. Lungs CTAB. Gastrointestinal: Right upper quadrant tenderness to palpation. No distention.   Musculoskeletal: No lower extremity tenderness nor edema. No gross deformities of extremities. Neurologic:  Normal speech and language. No gross focal neurologic deficits are  appreciated.  Skin:  Skin is warm, dry and intact. No rash noted. Psychiatric: Mood and affect are normal. Speech and behavior are normal.  ____________________________________________   LABS (all labs ordered are listed, but only abnormal results are displayed)  Labs Reviewed  COMPREHENSIVE METABOLIC PANEL - Abnormal; Notable for the following:       Result Value   Potassium 3.4 (*)    Glucose, Bld 127 (*)    Calcium 8.5 (*)    Total Bilirubin 0.2 (*)    All other components within normal limits  LIPASE, BLOOD  CBC  TROPONIN I  URINALYSIS, COMPLETE (UACMP) WITH MICROSCOPIC  POCT PREGNANCY, URINE  POC URINE PREG, ED     RADIOLOGY I, [place] BROWN, personally viewed and evaluated these images (plain radiographs) as part of my medical decision making, as well as reviewing the written report by the radiologist.  [place] Abdomen Limited Ruq  Result Date: 06/09/2017 CLINICAL DATA:  Abdominal pain for 1 day. EXAM: ULTRASOUND ABDOMEN LIMITED RIGHT UPPER QUADRANT COMPARISON:  12/23/2016 FINDINGS: Gallbladder: Multiple gallbladder calculi, measuring up to 1.8 cm. The patient was tender to probe pressure over the gallbladder. No gallbladder mural thickening or pericholecystic fluid. Common bile duct: Diameter: Normal, 3.3 mm. Liver: No focal lesion identified. Within normal limits in parenchymal echogenicity. Portal vein is patent on color Doppler imaging with normal direction of blood flow towards the liver. IMPRESSION: Cholelithiasis. Positive sonographic Murphy's sign. The findings are concerning for possible cholecystitis; however, there is no mural thickening or pericholecystic fluid. Electronically Signed   By: Ellery Plunkaniel R Mitchell M.D.   On: 06/09/2017 06:21     Procedures   ____________________________________________   INITIAL IMPRESSION / ASSESSMENT AND PLAN / ED COURSE  Pertinent labs & imaging results that were available during my care of the patient were reviewed by me and  considered in my medical decision making (see chart for details).  26 year old female presenting with above stated history consistent with cholelithiasis versus cholecystitis. Ultrasound performed which revealed cholelithiasis with a positive Murphy sign and a such concerning for possible cholecystitis. Patient discussed with Dr. Earlene Plateravis general surgeon on call who will evaluate patient in the emergency department.  Patient evaluated by Dr. Earlene Plateravis in the emergency department and will be discharged by him. Pain improved at this time no active vomiting or nausea. Patient will follow-up with [facility]    ____________________________________________  FINAL CLINICAL IMPRESSION(S) / ED DIAGNOSES  Final diagnoses:  Vomiting  Right upper quadrant pain  Calculus of gallbladder with acute cholecystitis without obstruction     MEDICATIONS GIVEN DURING THIS VISIT:  Medications  morphine 4 MG/ML injection 4 mg (not administered)  fentaNYL (SUBLIMAZE) injection 50 mcg (not administered)  morphine 2 MG/ML injection 2 mg (2 mg Intravenous Given 06/09/17 0549)  ondansetron (ZOFRAN) injection 4 mg (4 mg Intravenous Given 06/09/17 0546)     NEW OUTPATIENT MEDICATIONS STARTED DURING THIS VISIT:  New Prescriptions   No medications on file    Modified Medications  No medications on file    Discontinued Medications   No medications on file     Note:  This document was prepared using Dragon voice recognition software and may include unintentional dictation errors.    Darci Current, MD 06/09/17 [phone]

## 2017-06-09 NOTE — ED Notes (Signed)
Pt resting quietly in bed, visitor at bedside. Given remote to TV. Pt states surgeon had already been in to talk to the patient about modifying her diet and to follow up with surgery if continued pain.

## 2017-06-09 NOTE — Consult Note (Signed)
SURGICAL CONSULTATION NOTE (initial) - cpt: 40981  HISTORY OF PRESENT ILLNESS (HPI):  26 y.o. female presented to [facility] ED with RUQ abdominal pain. She describes that her pain during this episode began ~1:30 am early this morning after she ate beef stroganoff last night for dinner and then worsened prior to presenting at [facility] ED. She also reports nausea without emesis and loose BM's preceding this episode of abdominal pain. She was previously evaluated this past April by Dr. Tonita Cong, who determined her symptoms did not always correlate to diet or symptoms of biliary colic, and she was referred to GI, Dr. Sharlet Salina, who diagnosed patient with IBS and prescribed for her Bentyl. She describes immediate and near-complete relief with the Bentyl except for 2 episodes of RUQ pain while taking the Bentyl. She was then advised to follow up with GI and surgery as needed, after which she stopped taking Bentyl and experienced a few episodes of mild RUQ abdominal pain, loose BM's, and nausea until her "worst" episode of RUQ last night. After receiving morphine, patient describes the pain felt "numb", but returned when the morphine "wore off". Since receiving a dose of fentanyl, she says her pain feels "less", almost completely resolved.  Surgery is consulted by ED physician Dr. Manson Passey in this context for evaluation and management of cholelithiasis.  PAST MEDICAL HISTORY (PMH):  Past Medical History:  Diagnosis Date  . Cholelithiasis   . Irritable bowel syndrome (IBS) 2018     PAST SURGICAL HISTORY (PSH):  No past surgical history on file.   MEDICATIONS:  Prior to Admission medications   Medication Sig Start Date End Date Taking? Authorizing Provider  dicyclomine (BENTYL) 20 MG tablet Take 1 tablet (20 mg total) by mouth 4 (four) times daily as needed for spasms. 02/16/17 04/18/17  Wyline Mood, MD  omeprazole (PRILOSEC) 40 MG capsule Take 1 capsule (40 mg total) by mouth daily. 06/07/17   Wyline Mood, MD   ondansetron (ZOFRAN ODT) 4 MG disintegrating tablet Take 1 tablet (4 mg total) by mouth every 8 (eight) hours as needed for nausea or vomiting. 12/23/16   Darci Current, MD  oxyCODONE-acetaminophen (ROXICET) 5-325 MG tablet Take 1 tablet by mouth every 4 (four) hours as needed for severe pain. 12/23/16   Darci Current, MD     ALLERGIES:  No Known Allergies   SOCIAL HISTORY:  Social History   Social History  . Marital status: Married    Spouse name: N/A  . Number of children: N/A  . Years of education: N/A   Occupational History  . Not on file.   Social History Main Topics  . Smoking status: Never Smoker  . Smokeless tobacco: Never Used  . Alcohol use No  . Drug use: No  . Sexual activity: Yes   Other Topics Concern  . Not on file   Social History Narrative  . No narrative on file    The patient currently resides (home / rehab facility / nursing home): Home  The patient normally is (ambulatory / bedbound): Ambulatory   FAMILY HISTORY:  Family History  Problem Relation Age of Onset  . Hypertension Mother   . Cancer Maternal Grandmother   . Hypertension Maternal Grandmother   . Colon cancer Maternal Grandmother   . Heart disease Maternal Grandfather   . Hypertension Maternal Grandfather      REVIEW OF SYSTEMS:  Constitutional: denies weight loss, fever, chills, or sweats  Eyes: denies any other vision changes, history of eye injury  ENT: denies sore throat, hearing problems  Respiratory: denies shortness of breath, wheezing  Cardiovascular: denies chest pain, palpitations  Gastrointestinal: abdominal pain, N/V, and bowel function as per HPI Genitourinary: denies burning with urination or urinary frequency Musculoskeletal: denies any other joint pains or cramps  Skin: denies any other rashes or skin discolorations  Neurological: denies any other headache, dizziness, weakness  Psychiatric: denies any other depression, anxiety   All other review of systems  were negative   VITAL SIGNS:  Temp:  [98.4 F (36.9 C)] 98.4 F (36.9 C) (08/31 0403) Pulse Rate:  [65-85] 78 (08/31 0730) Resp:  [16-18] 18 (08/31 0730) BP: (129-143)/(76-95) 143/95 (08/31 0730) SpO2:  [99 %-100 %] 100 % (08/31 0730)             INTAKE/OUTPUT:  This shift: No intake/output data recorded.  Last 2 shifts: @IOLAST2SHIFTS @   PHYSICAL EXAM:  Constitutional:  -- Overweight body habitus  -- Awake, alert, and oriented x3  Eyes:  -- Pupils equally round and reactive to light  -- No scleral icterus  Ear, nose, and throat:  -- No jugular venous distension  Pulmonary:  -- No crackles  -- Equal breath sounds bilaterally -- Breathing non-labored at rest Cardiovascular:  -- S1, S2 present  -- No pericardial rubs Gastrointestinal:  -- Abdomen soft and non-distended with minimal-/mild- RUQ abdominal tenderness to palpation, no guarding/rebound  -- No abdominal masses appreciated, pulsatile or otherwise  Musculoskeletal and Integumentary:  -- Wounds or skin discoloration: None appreciated -- Extremities: B/L UE and LE FROM, hands and feet warm, no edema  Neurologic:  -- Motor function: intact and symmetric -- Sensation: intact and symmetric  Labs:  CBC Latest Ref Rng & Units 06/09/2017 12/23/2016  WBC 3.6 - 11.0 K/uL 8.1 9.7  Hemoglobin 12.0 - 16.0 g/dL 81.1 91.4  Hematocrit 78.2 - 47.0 % 37.1 38.5  Platelets 150 - 440 K/uL 378 415   CMP Latest Ref Rng & Units 06/09/2017 12/23/2016  Glucose 65 - 99 mg/dL 956(O) 130(Q)  BUN 6 - 20 mg/dL 15 16  Creatinine 6.57 - 1.00 mg/dL 8.46 9.62  Sodium 952 - 145 mmol/L 137 135  Potassium 3.5 - 5.1 mmol/L 3.4(L) 3.7  Chloride 101 - 111 mmol/L 103 101  CO2 22 - 32 mmol/L 27 28  Calcium 8.9 - 10.3 mg/dL 8.4(X) 9.4  Total Protein 6.5 - 8.1 g/dL 7.4 3.2(G)  Total Bilirubin 0.3 - 1.2 mg/dL 4.0(N) 0.5  Alkaline Phos 38 - 126 U/L 50 56  AST 15 - 41 U/L 18 25  ALT 14 - 54 U/L 17 26   Imaging studies:  Limited RUQ Abdominal  Ultrasound (06/09/2017) Multiple gallbladder calculi, measuring up to 1.8 cm. The patient was tender to probe pressure over the gallbladder. No gallbladder mural thickening or pericholecystic fluid.  Common bile duct diameter: 3.3 mm.   Assessment/Plan: (ICD-10's: K43.20) 26 y.o. female with recurrent RUQ abdominal pain, consistently focused over RUQ, attributable to ultrasound-demonstrated cholelithiasis without cholecystitis vs (or +/-) irritable bowel syndrome, for which patient was recently diagnosed and treated by GI, complicated by pertinent comorbidities including obesity (no weight available for BMI).   - no indication for emergent surgical intervention  - prefer fentanyl/Toradol over morphine/Dilaudid for biliary colic   - if pain relieved with Toradol, avoid fatty foods (meats, cheeses/dairy, and fried foods), favoring grains (cereals, breads, etc), vegetables, and fruits for the next 1 - 2 weeks   - outpatient surgical follow-up in 1 - 2 weeks, will  further discuss cholecystectomy for symptomatic relief and diagnostic clarity  All of the above findings and recommendations were discussed with the patient and ED physician (Dr. Manson PasseyBrown), patient expresses agreement with above plan, and all of patient's questions were answered to her expressed satisfaction.  Thank you for the opportunity to participate in this patient's care.   -- Scherrie GerlachJason E. Earlene Plateravis, MD, RPVI [place]: [facility] General [facility] - Partnering for exceptional care. Office: [phone]

## 2017-06-09 NOTE — ED Notes (Signed)
Patient transported to Ultrasound 

## 2017-06-09 NOTE — Discharge Instructions (Signed)
In addition to included general instructions regarding gallstones,  Diet: Resume home heart healthy NON-FAT diet (okay to eat grains/bread/cereals, vegetables, and fruits, while avoid meats, cheeses/dairy, and fried foods).   Activity: No restrictions.  Medications: Resume all home medications. Do not take morphine, dilaudid, or similar narcotics. For mild to moderate pain: acetaminophen (Tylenol) or ibuprofen (if no kidney disease). Combining Tylenol with alcohol can substantially increase your risk of causing liver disease.  Call office [phone]) at any time if any questions, worsening pain, fevers/chills, or other concerns.

## 2017-06-09 NOTE — ED Notes (Signed)
Discussed DC instructions including follow up with general surgery, pt verbalized understanding. Encouraged patient to call TODAY for an appt due to the upcoming holiday.  Pt ambulated to lobby without difficulty.

## 2017-06-09 NOTE — ED Triage Notes (Signed)
Pt ambulatory to triage with steady gait, no distress noted. Pt c/o upper epigastric abdominal pain x1 day, worsening tonight when pt laid down. Pt sts she has had same symptoms before and treated for gastric ulcer. Pt denies N/V/D.

## 2017-06-13 ENCOUNTER — Telehealth: Payer: Self-pay

## 2017-06-13 NOTE — Telephone Encounter (Signed)
Hospital Follow-up call made to patient at this time. Left message for patient to call back with any questions or concerns since being discharged from the hospital. I also reminded her of her follow up appointment on 9/6 with Dr. Earlene Plateravis at 9:45.

## 2017-06-15 ENCOUNTER — Encounter: Payer: Self-pay | Admitting: Surgery

## 2017-06-15 ENCOUNTER — Ambulatory Visit (INDEPENDENT_AMBULATORY_CARE_PROVIDER_SITE_OTHER): Payer: 59 | Admitting: Surgery

## 2017-06-15 VITALS — BP 134/98 | HR 105 | Temp 98.2°F | Ht 64.0 in | Wt 218.8 lb

## 2017-06-15 DIAGNOSIS — K8020 Calculus of gallbladder without cholecystitis without obstruction: Secondary | ICD-10-CM

## 2017-06-15 NOTE — Patient Instructions (Signed)
You have requested to have your gallbladder removed. This will be done on 06/21/17 at [facility] with Dr. Earlene Plateravis.  You will most likely be out of work 1-2 weeks for this surgery. You will return after your post-op appointment with a lifting restriction for approximately 4 more weeks.  You will be able to eat anything you would like to following surgery. But, start by eating a bland diet and advance this as tolerated. The Gallbladder diet is below, please go as closely by this diet as possible prior to surgery to avoid any further attacks.  Please see the (blue)pre-care form that you have been given today. If you have any questions, please call our office.  Laparoscopic Cholecystectomy Laparoscopic cholecystectomy is surgery to remove the gallbladder. The gallbladder is located in the upper right part of the abdomen, behind the liver. It is a storage sac for bile, which is produced in the liver. Bile aids in the digestion and absorption of fats. Cholecystectomy is often done for inflammation of the gallbladder (cholecystitis). This condition is usually caused by a buildup of gallstones (cholelithiasis) in the gallbladder. Gallstones can block the flow of bile, and that can result in inflammation and pain. In severe cases, emergency surgery may be required. If emergency surgery is not required, you will have time to prepare for the procedure. Laparoscopic surgery is an alternative to open surgery. Laparoscopic surgery has a shorter recovery time. Your common bile duct may also need to be examined during the procedure. If stones are found in the common bile duct, they may be removed. [facility] KNOW ABOUT:  Any allergies you have.  All medicines you are taking, including vitamins, herbs, eye drops, creams, and over-the-counter medicines.  Previous problems you or members of your family have had with the use of anesthetics.  Any blood disorders you have.  Previous surgeries  you have had.    Any medical conditions you have. RISKS AND COMPLICATIONS Generally, this is a safe procedure. However, problems may occur, including:  Infection.  Bleeding.  Allergic reactions to medicines.  Damage to other structures or organs.  A stone remaining in the common bile duct.  A bile leak from the cyst duct that is clipped when your gallbladder is removed.  The need to convert to open surgery, which requires a larger incision in the abdomen. This may be necessary if your surgeon thinks that it is not safe to continue with a laparoscopic procedure. BEFORE THE PROCEDURE  Ask your health care provider about:  Changing or stopping your regular medicines. This is especially important if you are taking diabetes medicines or blood thinners.  Taking medicines such as aspirin and ibuprofen. These medicines can thin your blood. Do not take these medicines before your procedure if your health care provider instructs you not to.  Follow instructions from your health care provider about eating or drinking restrictions.  Let your health care provider know if you develop a cold or an infection before surgery.  Plan to have someone take you home after the procedure.  Ask your health care provider how your surgical site will be marked or identified.  You may be given antibiotic medicine to help prevent infection. PROCEDURE  To reduce your risk of infection:  Your health care team will wash or sanitize their hands.  Your skin will be washed with soap.  An IV tube may be inserted into one of your veins.  You will be given a medicine to make  you fall asleep (general anesthetic).  A breathing tube will be placed in your mouth.  The surgeon will make several small cuts (incisions) in your abdomen.  A thin, lighted tube (laparoscope) that has a tiny camera on the end will be inserted through one of the small incisions. The camera on the laparoscope will send a picture to  a TV screen (monitor) in the operating room. This will give the surgeon a good view inside your abdomen.  A gas will be pumped into your abdomen. This will expand your abdomen to give the surgeon more room to perform the surgery.  Other tools that are needed for the procedure will be inserted through the other incisions. The gallbladder will be removed through one of the incisions.  After your gallbladder has been removed, the incisions will be closed with stitches (sutures), staples, or skin glue.  Your incisions may be covered with a bandage (dressing). The procedure may vary among health care providers and hospitals. AFTER THE PROCEDURE  Your blood pressure, heart rate, breathing rate, and blood oxygen level will be monitored often until the medicines you were given have worn off.  You will be given medicines as needed to control your pain.   This information is not intended to replace advice given to you by your health care provider. Make sure you discuss any questions you have with your health care provider.   Document Released: 09/26/2005 Document Revised: 06/17/2015 Document Reviewed: 05/08/2013 Elsevier Interactive Patient Education 2016 [place] Diet for Gallbladder Conditions A low-fat diet can be helpful if you have pancreatitis or a gallbladder condition. With these conditions, your pancreas and gallbladder have trouble digesting fats. A healthy eating plan with less fat will help rest your pancreas and gallbladder and reduce your symptoms. WHAT DO I NEED TO KNOW ABOUT THIS DIET?  Eat a low-fat diet.  Reduce your fat intake to less than 20-30% of your total daily calories. This is less than 50-60 g of fat per day.  Remember that you need some fat in your diet. Ask your dietician what your daily goal should be.  Choose nonfat and low-fat healthy foods. Look for the words "nonfat," "low fat," or "fat free."  As a guide, look on the label and choose foods  with less than 3 g of fat per serving. Eat only one serving.  Avoid alcohol.  Do not smoke. If you need help quitting, talk with your health care provider.  Eat small frequent meals instead of three large heavy meals. WHAT FOODS CAN I EAT? Grains Include healthy grains and starches such as potatoes, wheat bread, fiber-rich cereal, and brown rice. Choose whole grain options whenever possible. In adults, whole grains should account for 45-65% of your daily calories.  Fruits and Vegetables Eat plenty of fruits and vegetables. Fresh fruits and vegetables add fiber to your diet. Meats and Other Protein Sources Eat lean meat such as chicken and pork. Trim any fat off of meat before cooking it. Eggs, fish, and beans are other sources of protein. In adults, these foods should account for 10-35% of your daily calories. Dairy Choose low-fat milk and dairy options. Dairy includes fat and protein, as well as calcium.  Fats and Oils Limit high-fat foods such as fried foods, sweets, baked goods, sugary drinks.  Other Creamy sauces and condiments, such as mayonnaise, can add extra fat. Think about whether or not you need to use them, or use smaller amounts or low fat options.  WHAT FOODS ARE NOT RECOMMENDED?  High fat foods, such as:  [employer].  Ice cream.  [place] toast.  Sweet rolls.  Pizza.  Cheese bread.  Foods covered with batter, butter, creamy sauces, or cheese.  Fried foods.  Sugary drinks and desserts.  Foods that cause gas or bloating   This information is not intended to replace advice given to you by your health care provider. Make sure you discuss any questions you have with your health care provider.   Document Released: 10/01/2013 Document Reviewed: 10/01/2013 Elsevier Interactive Patient Education [employer].

## 2017-06-15 NOTE — Progress Notes (Signed)
Surgical Clinic Progress/Follow-up Note   HPI:  26 y.o. Female presents to clinic for follow-up evaluation of abdominal pain with cholelithiasis. Patient reports all of her abdominal pain and N/V have near-completely resolved since she's eliminated high fat foods from her diet as discussed at [facility] ED 1 week ago. She has also not taken the Bentyl and Prilosec prescribed by GI for her symptoms, and she has avoided taking any of the narcotic pain medication prescribed for her by ED physician at a prior presentation. However, patient states that she wants to be able to eat a more normal diet without worrying about causing herself pain and nausea. She otherwise denies any GERD/heartburn, fever/chills, CP, or SOB.  Review of Systems:  Constitutional: denies any other weight loss, fever, chills, or sweats  Eyes: denies any other vision changes, history of eye injury  ENT: denies sore throat, hearing problems  Respiratory: denies shortness of breath, wheezing  Cardiovascular: denies chest pain, palpitations  Gastrointestinal: abdominal pain, N/V, and bowel function as per HPI Musculoskeletal: denies any other joint pains or cramps  Skin: Denies any other rashes or skin discolorations  Neurological: denies any other headache, dizziness, weakness  Psychiatric: denies any other depression, anxiety  All other review of systems: otherwise negative   Vital Signs:  BP (!) 134/98   Pulse (!) 105   Temp 98.2 F (36.8 C) (Oral)   Ht 5' 4" (1.626 m)   Wt 218 lb 12.8 oz (99.2 kg)   LMP 05/29/2017 (Exact Date)   BMI 37.56 kg/m    Physical Exam:  Constitutional:  -- Overweight body habitus  -- Awake, alert, and oriented x3  Eyes:  -- Pupils equally round and reactive to light  -- No scleral icterus  Ear, nose, throat:  -- No jugular venous distension  -- No nasal drainage, bleeding Pulmonary:  -- No crackles -- Equal breath sounds bilaterally -- Breathing non-labored at rest Cardiovascular:   -- S1, S2 present  -- No pericardial rubs  Gastrointestinal:  -- Soft, nontender, non-distended, no guarding/rebound  -- No abdominal masses appreciated, pulsatile or otherwise  Musculoskeletal / Integumentary:  -- Wounds or skin discoloration: None appreciated  -- Extremities: B/L UE and LE FROM, hands and feet warm, no edema  Neurologic:  -- Motor function: intact and symmetric  -- Sensation: intact and symmetric   Laboratory studies:  CBC Latest Ref Rng & Units 06/09/2017 12/23/2016  WBC 3.6 - 11.0 K/uL 8.1 9.7  Hemoglobin 12.0 - 16.0 g/dL 12.6 13.2  Hematocrit 35.0 - 47.0 % 37.1 38.5  Platelets 150 - 440 K/uL 378 415   CMP Latest Ref Rng & Units 06/09/2017 12/23/2016  Glucose 65 - 99 mg/dL 127(H) 114(H)  BUN 6 - 20 mg/dL 15 16  Creatinine 0.44 - 1.00 mg/dL 0.75 0.63  Sodium 135 - 145 mmol/L 137 135  Potassium 3.5 - 5.1 mmol/L 3.4(L) 3.7  Chloride 101 - 111 mmol/L 103 101  CO2 22 - 32 mmol/L 27 28  Calcium 8.9 - 10.3 mg/dL 8.5(L) 9.4  Total Protein 6.5 - 8.1 g/dL 7.4 8.4(H)  Total Bilirubin 0.3 - 1.2 mg/dL 0.2(L) 0.5  Alkaline Phos 38 - 126 U/L 50 56  AST 15 - 41 U/L 18 25  ALT 14 - 54 U/L 17 26    Imaging:  Limited RUQ Abdominal Ultrasound (06/09/2017) Cholelithiasis. Positive sonographic Murphy's sign. The findings are concerning for possible cholecystitis; however, there is no mural thickening or pericholecystic fluid.  Assessment:  26 y.o. yo   Female with a problem list including...  Patient Active Problem List   Diagnosis Date Noted  . Right upper quadrant abdominal pain 01/18/2017  . Cholelithiasis     presents to clinic for follow-up evaluation of abdominal pain and nausea, which appears to be attributable at least in part to symptomatic cholelithiasis, complicated by comorbidities including obesity (BMI 38) and +/- IBS, GERD.  Plan:   - continue to minimize/avoid fatty foods (meats, cheeses/dairy, and fried foods), favoring grains (cereals, breads, etc),  vegetables, and fruits  - all risks, benefits, and alternatives to elective cholecystectomy were discussed with the patient, all of her questions were answered to her expressed satisfaction, patient expresses she wishes to proceed, and informed consent was obtained.  - will plan for elective outpatient laparoscopic cholecystectomy on Wednesday, 9/12  - outpatient surgical follow-up 2 weeks after above procedure  - instructed to call office if any questions or concerns  - weight loss encouraged  All of the above recommendations were discussed with the patient, and all of patient's questions were answered to her expressed satisfaction.  -- Scherrie GerlachJason E. Earlene Plateravis, MD, RPVI [place]: [facility] General [facility] - Partnering for exceptional care. Office: [phone]

## 2017-06-19 ENCOUNTER — Telehealth: Payer: Self-pay | Admitting: Surgery

## 2017-06-19 ENCOUNTER — Encounter
Admission: RE | Admit: 2017-06-19 | Discharge: 2017-06-19 | Disposition: A | Discharge status: Home / Self Care | Payer: 59 | Source: Ambulatory Visit | Attending: Surgery | Admitting: Surgery

## 2017-06-19 HISTORY — DX: Anxiety disorder, unspecified: F41.9

## 2017-06-19 NOTE — Telephone Encounter (Signed)
Pt advised of pre op date/time and sx date. Sx: 06/21/17 with Dr Davis--Laparoscopic cholecystectomy.  Pre op: 06/19/17 between 1-5:00pm--Phone.   Patient made aware to call [phone], between 1-3:00pm the day before surgery, to find out what time to arrive.

## 2017-06-19 NOTE — Patient Instructions (Signed)
Your procedure is scheduled on: 06-21-17 Report to Same Day Surgery 2nd floor medical mall [facility](Medical Mall Entrance-take elevator on left to 2nd floor.  Check in with surgery information desk.) To find out your arrival time please call [phone] between 1PM - 3PM on 06-20-17  Remember: Instructions that are not followed completely may result in serious medical risk, up to and including death, or upon the discretion of your surgeon and anesthesiologist your surgery may need to be rescheduled.    _x___ 1. Do not eat food after midnight the night before your procedure. You may drink clear liquids up to 2 hours before you are scheduled to arrive at the hospital for your procedure.  Do not drink clear liquids within 2 hours of your scheduled arrival to the hospital.  Clear liquids include  --Water or Apple juice without pulp  --Clear carbohydrate beverage such as ClearFast or Gatorade  --Black Coffee or Clear Tea (No milk, no creamers, do not add anything to the coffee or Te Type 1 and type 2 diabetics should only drink water.  No gum chewing or hard candies.     __x__ 2. No Alcohol for 24 hours before or after surgery.   __x__3. No Smoking for 24 prior to surgery.   ____  4. Bring all medications with you on the day of surgery if instructed.    __x__ 5. Notify your doctor if there is any change in your medical condition     (cold, fever, infections).     Do not wear jewelry, make-up, hairpins, clips or nail polish.  Do not wear lotions, powders, or perfumes. You may wear deodorant.  Do not shave 48 hours prior to surgery. Men may shave face and neck.  Do not bring valuables to the hospital.    [facility] is not responsible for any belongings or valuables.               Contacts, dentures or bridgework may not be worn into surgery.  Leave your suitcase in the car. After surgery it may be brought to your room.  For patients admitted to the hospital, discharge time is determined by your  treatment team.   Patients discharged the day of surgery will not be allowed to drive home.  You will need someone to drive you home and stay with you the night of your procedure.    Please read over the following fact sheets that you were given:   [facility] Preparing for Surgery and or MRSA Information   ____ Take anti-hypertensive listed below, cardiac, seizure, asthma,     anti-reflux and psychiatric medicines. These include:  1. NONE  2.  3.  4.  5.  6.  ____Fleets enema or Magnesium Citrate as directed.   ____ Use CHG Soap or sage wipes as directed on instruction sheet   ____ Use inhalers on the day of surgery and bring to hospital day of surgery  ____ Stop Metformin and Janumet 2 days prior to surgery.    ____ Take 1/2 of usual insulin dose the night before surgery and none on the morning     surgery.   ____ Follow recommendations from Cardiologist, Pulmonologist or PCP regarding          stopping Aspirin, Coumadin, Plavix ,Eliquis, Effient, or Pradaxa, and Pletal.  X____Stop Anti-inflammatories such as Advil, Aleve, IBUPROFEN, Motrin, Naproxen, Naprosyn, Goodies powders or aspirin products NOW-OK to take Tylenol    ____ Stop supplements until after surgery.  ____ Bring C-Pap to the hospital.

## 2017-06-20 ENCOUNTER — Other Ambulatory Visit: Payer: Self-pay

## 2017-06-20 DIAGNOSIS — K8020 Calculus of gallbladder without cholecystitis without obstruction: Secondary | ICD-10-CM

## 2017-06-20 MED ORDER — CEFAZOLIN SODIUM-DEXTROSE 2-4 GM/100ML-% IV SOLN
2.0000 g | INTRAVENOUS | Status: AC
  Administered 2017-06-21: 2 g via INTRAVENOUS

## 2017-06-21 ENCOUNTER — Ambulatory Visit
Admission: RE | Admit: 2017-06-21 | Discharge: 2017-06-21 | Disposition: A | Discharge status: Home / Self Care | Payer: 59 | Source: Ambulatory Visit | Attending: Surgery | Admitting: Surgery

## 2017-06-21 ENCOUNTER — Ambulatory Visit: Payer: 59 | Admitting: Anesthesiology

## 2017-06-21 ENCOUNTER — Encounter
Admission: RE | Disposition: A | Discharge status: Home / Self Care | Payer: Self-pay | Source: Ambulatory Visit | Attending: Surgery

## 2017-06-21 ENCOUNTER — Encounter: Payer: Self-pay | Admitting: *Deleted

## 2017-06-21 DIAGNOSIS — K8020 Calculus of gallbladder without cholecystitis without obstruction: Secondary | ICD-10-CM

## 2017-06-21 DIAGNOSIS — K8012 Calculus of gallbladder with acute and chronic cholecystitis without obstruction: Secondary | ICD-10-CM | POA: Diagnosis not present

## 2017-06-21 DIAGNOSIS — R109 Unspecified abdominal pain: Secondary | ICD-10-CM | POA: Diagnosis present

## 2017-06-21 DIAGNOSIS — K811 Chronic cholecystitis: Secondary | ICD-10-CM | POA: Diagnosis not present

## 2017-06-21 HISTORY — PX: CHOLECYSTECTOMY: SHX55

## 2017-06-21 LAB — POCT PREGNANCY, URINE: Preg Test, Ur: NEGATIVE

## 2017-06-21 SURGERY — LAPAROSCOPIC CHOLECYSTECTOMY
Anesthesia: General | Wound class: Contaminated

## 2017-06-21 MED ORDER — ONDANSETRON HCL 4 MG/2ML IJ SOLN: 4.0000 mg | Freq: Once | INTRAMUSCULAR | Status: DC | PRN

## 2017-06-21 MED ORDER — OXYCODONE-ACETAMINOPHEN 5-325 MG PO TABS: 1.0000 | ORAL_TABLET | Freq: Once | ORAL | Status: DC

## 2017-06-21 MED ORDER — PROPOFOL 10 MG/ML IV BOLUS
INTRAVENOUS | Status: AC
  Filled 2017-06-21: qty 20

## 2017-06-21 MED ORDER — FENTANYL CITRATE (PF) 100 MCG/2ML IJ SOLN
INTRAMUSCULAR | Status: AC
  Administered 2017-06-21: 25 ug via INTRAVENOUS
  Filled 2017-06-21: qty 2

## 2017-06-21 MED ORDER — LIDOCAINE HCL (PF) 2 % IJ SOLN
INTRAMUSCULAR | Status: AC
  Filled 2017-06-21: qty 2

## 2017-06-21 MED ORDER — LIDOCAINE HCL (PF) 1 % IJ SOLN
INTRAMUSCULAR | Status: AC
  Filled 2017-06-21: qty 30

## 2017-06-21 MED ORDER — ONDANSETRON HCL 4 MG/2ML IJ SOLN
INTRAMUSCULAR | Status: AC
  Filled 2017-06-21: qty 2

## 2017-06-21 MED ORDER — BUPIVACAINE-EPINEPHRINE (PF) 0.5% -1:200000 IJ SOLN
INTRAMUSCULAR | Status: AC
  Filled 2017-06-21: qty 30

## 2017-06-21 MED ORDER — FENTANYL CITRATE (PF) 100 MCG/2ML IJ SOLN
25.0000 ug | INTRAMUSCULAR | Status: DC | PRN
  Administered 2017-06-21 (×2): 25 ug via INTRAVENOUS

## 2017-06-21 MED ORDER — OXYCODONE-ACETAMINOPHEN 5-325 MG PO TABS
ORAL_TABLET | ORAL | Status: AC
  Administered 2017-06-21: 11:00:00 via ORAL
  Filled 2017-06-21: qty 1

## 2017-06-21 MED ORDER — DEXAMETHASONE SODIUM PHOSPHATE 10 MG/ML IJ SOLN
INTRAMUSCULAR | Status: DC | PRN
  Administered 2017-06-21: 5 mg via INTRAVENOUS

## 2017-06-21 MED ORDER — SUCCINYLCHOLINE CHLORIDE 20 MG/ML IJ SOLN
INTRAMUSCULAR | Status: AC
  Filled 2017-06-21: qty 1

## 2017-06-21 MED ORDER — LACTATED RINGERS IV SOLN
INTRAVENOUS | Status: DC
  Administered 2017-06-21: 07:00:00 via INTRAVENOUS

## 2017-06-21 MED ORDER — LIDOCAINE HCL (PF) 1 % IJ SOLN
INTRAMUSCULAR | Status: DC | PRN
  Administered 2017-06-21: 11 mL

## 2017-06-21 MED ORDER — ESMOLOL HCL 100 MG/10ML IV SOLN
INTRAVENOUS | Status: DC | PRN
  Administered 2017-06-21: 10 mg via INTRAVENOUS

## 2017-06-21 MED ORDER — ROCURONIUM BROMIDE 100 MG/10ML IV SOLN
INTRAVENOUS | Status: DC | PRN
  Administered 2017-06-21 (×2): 5 mg via INTRAVENOUS
  Administered 2017-06-21: 40 mg via INTRAVENOUS

## 2017-06-21 MED ORDER — OXYCODONE-ACETAMINOPHEN 5-325 MG PO TABS
ORAL_TABLET | ORAL | Status: AC
  Administered 2017-06-21: 1 via ORAL
  Filled 2017-06-21: qty 1

## 2017-06-21 MED ORDER — KETOROLAC TROMETHAMINE 30 MG/ML IJ SOLN
INTRAMUSCULAR | Status: DC | PRN
  Administered 2017-06-21: 30 mg via INTRAVENOUS

## 2017-06-21 MED ORDER — SUGAMMADEX SODIUM 200 MG/2ML IV SOLN
INTRAVENOUS | Status: DC | PRN
  Administered 2017-06-21: 200 mg via INTRAVENOUS

## 2017-06-21 MED ORDER — FENTANYL CITRATE (PF) 100 MCG/2ML IJ SOLN
INTRAMUSCULAR | Status: AC
  Filled 2017-06-21: qty 2

## 2017-06-21 MED ORDER — EPHEDRINE SULFATE 50 MG/ML IJ SOLN
INTRAMUSCULAR | Status: AC
  Filled 2017-06-21: qty 1

## 2017-06-21 MED ORDER — MIDAZOLAM HCL 2 MG/2ML IJ SOLN
INTRAMUSCULAR | Status: AC
  Filled 2017-06-21: qty 2

## 2017-06-21 MED ORDER — KETOROLAC TROMETHAMINE 30 MG/ML IJ SOLN
INTRAMUSCULAR | Status: AC
  Filled 2017-06-21: qty 1

## 2017-06-21 MED ORDER — ACETAMINOPHEN 10 MG/ML IV SOLN
INTRAVENOUS | Status: AC
  Filled 2017-06-21: qty 100

## 2017-06-21 MED ORDER — DEXAMETHASONE SODIUM PHOSPHATE 10 MG/ML IJ SOLN
INTRAMUSCULAR | Status: AC
  Filled 2017-06-21: qty 1

## 2017-06-21 MED ORDER — CHLORHEXIDINE GLUCONATE CLOTH 2 % EX PADS: 6.0000 | MEDICATED_PAD | Freq: Once | CUTANEOUS | Status: DC

## 2017-06-21 MED ORDER — PROPOFOL 10 MG/ML IV BOLUS
INTRAVENOUS | Status: DC | PRN
  Administered 2017-06-21: 180 mg via INTRAVENOUS

## 2017-06-21 MED ORDER — FENTANYL CITRATE (PF) 100 MCG/2ML IJ SOLN
INTRAMUSCULAR | Status: DC | PRN
  Administered 2017-06-21: 100 ug via INTRAVENOUS
  Administered 2017-06-21 (×2): 50 ug via INTRAVENOUS

## 2017-06-21 MED ORDER — ONDANSETRON HCL 4 MG/2ML IJ SOLN
INTRAMUSCULAR | Status: DC | PRN
  Administered 2017-06-21: 4 mg via INTRAVENOUS

## 2017-06-21 MED ORDER — CEFAZOLIN SODIUM-DEXTROSE 2-4 GM/100ML-% IV SOLN
INTRAVENOUS | Status: AC
  Filled 2017-06-21: qty 100

## 2017-06-21 MED ORDER — OXYCODONE-ACETAMINOPHEN 5-325 MG PO TABS
1.0000 | ORAL_TABLET | ORAL | Status: AC | PRN
  Administered 2017-06-21 (×2): 1 via ORAL

## 2017-06-21 MED ORDER — FAMOTIDINE 20 MG PO TABS
20.0000 mg | ORAL_TABLET | Freq: Once | ORAL | Status: AC
  Administered 2017-06-21: 20 mg via ORAL

## 2017-06-21 MED ORDER — BUPIVACAINE-EPINEPHRINE (PF) 0.5% -1:200000 IJ SOLN
INTRAMUSCULAR | Status: DC | PRN
  Administered 2017-06-21: 10 mL via PERINEURAL

## 2017-06-21 MED ORDER — OXYCODONE-ACETAMINOPHEN 5-325 MG PO TABS: 1.0000 | ORAL_TABLET | ORAL | 0 refills | Status: DC | PRN

## 2017-06-21 MED ORDER — ACETAMINOPHEN 10 MG/ML IV SOLN
INTRAVENOUS | Status: DC | PRN
  Administered 2017-06-21: 1000 mg via INTRAVENOUS

## 2017-06-21 MED ORDER — LIDOCAINE HCL (CARDIAC) 20 MG/ML IV SOLN
INTRAVENOUS | Status: DC | PRN
  Administered 2017-06-21: 80 mg via INTRAVENOUS

## 2017-06-21 MED ORDER — ROCURONIUM BROMIDE 50 MG/5ML IV SOLN
INTRAVENOUS | Status: AC
  Filled 2017-06-21: qty 1

## 2017-06-21 MED ORDER — PHENYLEPHRINE HCL 10 MG/ML IJ SOLN
INTRAMUSCULAR | Status: AC
  Filled 2017-06-21: qty 1

## 2017-06-21 MED ORDER — FAMOTIDINE 20 MG PO TABS
ORAL_TABLET | ORAL | Status: AC
  Administered 2017-06-21: 20 mg via ORAL
  Filled 2017-06-21: qty 1

## 2017-06-21 MED ORDER — MIDAZOLAM HCL 2 MG/2ML IJ SOLN
INTRAMUSCULAR | Status: DC | PRN
  Administered 2017-06-21: 2 mg via INTRAVENOUS

## 2017-06-21 MED ORDER — SUGAMMADEX SODIUM 200 MG/2ML IV SOLN
INTRAVENOUS | Status: AC
  Filled 2017-06-21: qty 2

## 2017-06-21 SURGICAL SUPPLY — 41 items
ADH SKN CLS APL DERMABOND .7 (GAUZE/BANDAGES/DRESSINGS) ×1
APL SRG 38 LTWT LNG FL B (MISCELLANEOUS) ×1
APPLICATOR ARISTA FLEXITIP XL (MISCELLANEOUS) ×2 IMPLANT
APPLIER CLIP ROT 10 11.4 M/L (STAPLE) ×3
APR CLP MED LRG 11.4X10 (STAPLE) ×1
BAG SPEC RTRVL LRG 6X4 10 (ENDOMECHANICALS) ×1
CHLORAPREP W/TINT 26ML (MISCELLANEOUS) ×3 IMPLANT
CLIP APPLIE ROT 10 11.4 M/L (STAPLE) ×1 IMPLANT
DECANTER SPIKE VIAL GLASS SM (MISCELLANEOUS) ×6 IMPLANT
DERMABOND ADVANCED (GAUZE/BANDAGES/DRESSINGS) ×2
DERMABOND ADVANCED .7 DNX12 (GAUZE/BANDAGES/DRESSINGS) ×1 IMPLANT
DEVICE PMI PUNCTURE CLOSURE (MISCELLANEOUS) ×3 IMPLANT
DRESSING SURGICEL FIBRLLR 1X2 (HEMOSTASIS) IMPLANT
DRSG SURGICEL FIBRILLAR 1X2 (HEMOSTASIS)
ELECT REM PT RETURN 9FT ADLT (ELECTROSURGICAL) ×3
ELECTRODE REM PT RTRN 9FT ADLT (ELECTROSURGICAL) ×1 IMPLANT
GLOVE BIO SURGEON STRL SZ7 (GLOVE) ×9 IMPLANT
GLOVE BIOGEL PI IND STRL 7.5 (GLOVE) ×1 IMPLANT
GLOVE BIOGEL PI INDICATOR 7.5 (GLOVE) ×8
GOWN STRL REUS W/ TWL LRG LVL3 (GOWN DISPOSABLE) ×3 IMPLANT
GOWN STRL REUS W/TWL LRG LVL3 (GOWN DISPOSABLE) ×9
HEMOSTAT ARISTA ABSORB 3G PWDR (MISCELLANEOUS) ×2 IMPLANT
IRRIGATION STRYKERFLOW (MISCELLANEOUS) IMPLANT
IRRIGATOR STRYKERFLOW (MISCELLANEOUS) ×3
IV NS 1000ML (IV SOLUTION) ×3
IV NS 1000ML BAXH (IV SOLUTION) ×1 IMPLANT
KIT RM TURNOVER STRD PROC AR (KITS) ×3 IMPLANT
NDL INSUFFLATION 14GA 120MM (NEEDLE) ×1 IMPLANT
NEEDLE HYPO 22GX1.5 SAFETY (NEEDLE) ×3 IMPLANT
NEEDLE INSUFFLATION 14GA 120MM (NEEDLE) ×3 IMPLANT
NS IRRIG 1000ML POUR BTL (IV SOLUTION) ×3 IMPLANT
PACK LAP CHOLECYSTECTOMY (MISCELLANEOUS) ×3 IMPLANT
POUCH SPECIMEN RETRIEVAL 10MM (ENDOMECHANICALS) ×3 IMPLANT
SCISSORS METZENBAUM CVD 33 (INSTRUMENTS) ×2 IMPLANT
SLEEVE ENDOPATH XCEL 5M (ENDOMECHANICALS) ×6 IMPLANT
SUT MNCRL AB 4-0 PS2 18 (SUTURE) ×3 IMPLANT
SUT VICRYL 0 UR6 27IN ABS (SUTURE) ×3 IMPLANT
SUT VICRYL AB 3-0 FS1 BRD 27IN (SUTURE) ×3 IMPLANT
TROCAR XCEL NON-BLD 11X100MML (ENDOMECHANICALS) ×3 IMPLANT
TROCAR XCEL NON-BLD 5MMX100MML (ENDOMECHANICALS) ×3 IMPLANT
TUBING INSUFFLATION (TUBING) ×3 IMPLANT

## 2017-06-21 NOTE — Transfer of Care (Signed)
Immediate Anesthesia Transfer of Care Note  Patient: Briana BattyKelsey L Picinich  Procedure(s) Performed: Procedure(s): LAPAROSCOPIC CHOLECYSTECTOMY (N/A)  Patient Location: PACU  Anesthesia Type:General  Level of Consciousness: awake, alert  and oriented  Airway & Oxygen Therapy: Patient Spontanous Breathing and Patient connected to face mask oxygen  Post-op Assessment: Report given to RN and Post -op Vital signs reviewed and stable  Post vital signs: Reviewed and stable  Last Vitals:  Vitals:   06/21/17 0610 06/21/17 0947  BP: 128/89 (!) 103/54  Pulse: 86 (!) 102  Resp: 18 16  Temp:  36.8 C  SpO2: 99% 95%    Last Pain:  Vitals:   06/21/17 0947  TempSrc: Temporal  PainSc:          Complications: No apparent anesthesia complications

## 2017-06-21 NOTE — Anesthesia Preprocedure Evaluation (Signed)
Anesthesia Evaluation  Patient identified by MRN, date of birth, ID band Patient awake    Reviewed: Allergy & Precautions, NPO status , Patient's Chart, lab work & pertinent test results, reviewed documented beta blocker date and time   Airway Mallampati: III  TM Distance: >3 FB     Dental  (+) Chipped   Pulmonary           Cardiovascular      Neuro/Psych Anxiety    GI/Hepatic   Endo/Other    Renal/GU      Musculoskeletal   Abdominal   Peds  Hematology   Anesthesia Other Findings Obese.  Reproductive/Obstetrics                             Anesthesia Physical Anesthesia Plan  ASA: III  Anesthesia Plan: General   Post-op Pain Management:    Induction: Intravenous  PONV Risk Score and Plan:   Airway Management Planned: Oral ETT  Additional Equipment:   Intra-op Plan:   Post-operative Plan:   Informed Consent: I have reviewed the patients History and Physical, chart, labs and discussed the procedure including the risks, benefits and alternatives for the proposed anesthesia with the patient or authorized representative who has indicated his/her understanding and acceptance.     Plan Discussed with: CRNA  Anesthesia Plan Comments:         Anesthesia Quick Evaluation

## 2017-06-21 NOTE — Interval H&P Note (Signed)
History and Physical Interval Note:  06/21/2017 7:25 AM  Briana Barber  has presented today for surgery, with the diagnosis of cholelithiasis  The various methods of treatment have been discussed with the patient and family. After consideration of risks, benefits and other options for treatment, the patient has consented to  Procedure(s): LAPAROSCOPIC CHOLECYSTECTOMY (N/A) as a surgical intervention .  The patient's history has been reviewed, patient examined, no change in status, stable for surgery.  I have reviewed the patient's chart and labs.  Questions were answered to the patient's satisfaction.     Ancil LinseyJason Evan Johann Santone

## 2017-06-21 NOTE — Anesthesia Post-op Follow-up Note (Signed)
Anesthesia QCDR form completed.        

## 2017-06-21 NOTE — Anesthesia Procedure Notes (Signed)
Procedure Name: Intubation Date/Time: 06/21/2017 7:42 AM Performed by: Hedda Slade Pre-anesthesia Checklist: Patient identified, Patient being monitored, Timeout performed, Emergency Drugs available and Suction available Patient Re-evaluated:Patient Re-evaluated prior to induction Oxygen Delivery Method: Circle system utilized Preoxygenation: Pre-oxygenation with 100% oxygen Induction Type: IV induction Ventilation: Mask ventilation without difficulty Laryngoscope Size: Mac and 3 Grade View: Grade I Tube type: Oral Tube size: 7.0 mm Number of attempts: 1 Airway Equipment and Method: Stylet Placement Confirmation: ETT inserted through vocal cords under direct vision,  positive ETCO2 and breath sounds checked- equal and bilateral Secured at: 21 cm Tube secured with: Tape Dental Injury: Teeth and Oropharynx as per pre-operative assessment

## 2017-06-21 NOTE — H&P (View-Only) (Signed)
Surgical Clinic Progress/Follow-up Note   HPI:  26 y.o. Female presents to clinic for follow-up evaluation of abdominal pain with cholelithiasis. Patient reports all of her abdominal pain and N/V have near-completely resolved since she's eliminated high fat foods from her diet as discussed at [facility] ED 1 week ago. She has also not taken the Bentyl and Prilosec prescribed by GI for her symptoms, and she has avoided taking any of the narcotic pain medication prescribed for her by ED physician at a prior presentation. However, patient states that she wants to be able to eat a more normal diet without worrying about causing herself pain and nausea. She otherwise denies any GERD/heartburn, fever/chills, CP, or SOB.  Review of Systems:  Constitutional: denies any other weight loss, fever, chills, or sweats  Eyes: denies any other vision changes, history of eye injury  ENT: denies sore throat, hearing problems  Respiratory: denies shortness of breath, wheezing  Cardiovascular: denies chest pain, palpitations  Gastrointestinal: abdominal pain, N/V, and bowel function as per HPI Musculoskeletal: denies any other joint pains or cramps  Skin: Denies any other rashes or skin discolorations  Neurological: denies any other headache, dizziness, weakness  Psychiatric: denies any other depression, anxiety  All other review of systems: otherwise negative   Vital Signs:  BP (!) 134/98   Pulse (!) 105   Temp 98.2 F (36.8 C) (Oral)   Ht  (1.626 m)   Wt 218 lb 12.8 oz (99.2 kg)   LMP 05/29/2017 (Exact Date)   BMI 37.56 kg/m    Physical Exam:  Constitutional:  -- Overweight body habitus  -- Awake, alert, and oriented x3  Eyes:  -- Pupils equally round and reactive to light  -- No scleral icterus  Ear, nose, throat:  -- No jugular venous distension  -- No nasal drainage, bleeding Pulmonary:  -- No crackles -- Equal breath sounds bilaterally -- Breathing non-labored at rest Cardiovascular:   -- S1, S2 present  -- No pericardial rubs  Gastrointestinal:  -- Soft, nontender, non-distended, no guarding/rebound  -- No abdominal masses appreciated, pulsatile or otherwise  Musculoskeletal / Integumentary:  -- Wounds or skin discoloration: None appreciated  -- Extremities: B/L UE and LE FROM, hands and feet warm, no edema  Neurologic:  -- Motor function: intact and symmetric  -- Sensation: intact and symmetric   Laboratory studies:  CBC Latest Ref Rng & Units 06/09/2017 12/23/2016  WBC 3.6 - 11.0 K/uL 8.1 9.7  Hemoglobin 12.0 - 16.0 g/dL 29.5 28.4  Hematocrit 13.2 - 47.0 % 37.1 38.5  Platelets 150 - 440 K/uL 378 415   CMP Latest Ref Rng & Units 06/09/2017 12/23/2016  Glucose 65 - 99 mg/dL 440(N) 027(O)  BUN 6 - 20 mg/dL 15 16  Creatinine 5.36 - 1.00 mg/dL 6.44 0.34  Sodium 742 - 145 mmol/L 137 135  Potassium 3.5 - 5.1 mmol/L 3.4(L) 3.7  Chloride 101 - 111 mmol/L 103 101  CO2 22 - 32 mmol/L 27 28  Calcium 8.9 - 10.3 mg/dL 5.9(D) 9.4  Total Protein 6.5 - 8.1 g/dL 7.4 6.3(O)  Total Bilirubin 0.3 - 1.2 mg/dL 7.5(I) 0.5  Alkaline Phos 38 - 126 U/L 50 56  AST 15 - 41 U/L 18 25  ALT 14 - 54 U/L 17 26    Imaging:  Limited RUQ Abdominal Ultrasound (06/09/2017) Cholelithiasis. Positive sonographic Murphy's sign. The findings are concerning for possible cholecystitis; however, there is no mural thickening or pericholecystic fluid.  Assessment:  26 y.o. yo  Female with a problem list including...  Patient Active Problem List   Diagnosis Date Noted  . Right upper quadrant abdominal pain 01/18/2017  . Cholelithiasis     presents to clinic for follow-up evaluation of abdominal pain and nausea, which appears to be attributable at least in part to symptomatic cholelithiasis, complicated by comorbidities including obesity (BMI 38) and +/- IBS, GERD.  Plan:   - continue to minimize/avoid fatty foods (meats, cheeses/dairy, and fried foods), favoring grains (cereals, breads, etc),  vegetables, and fruits  - all risks, benefits, and alternatives to elective cholecystectomy were discussed with the patient, all of her questions were answered to her expressed satisfaction, patient expresses she wishes to proceed, and informed consent was obtained.  - will plan for elective outpatient laparoscopic cholecystectomy on Wednesday, 9/12  - outpatient surgical follow-up 2 weeks after above procedure  - instructed to call office if any questions or concerns  - weight loss encouraged  All of the above recommendations were discussed with the patient, and all of patient's questions were answered to her expressed satisfaction.  -- Scherrie GerlachJason E. Earlene Plateravis, MD, RPVI [place]: [facility] - Partnering for exceptional care. Office: [phone]

## 2017-06-21 NOTE — Op Note (Signed)
SURGICAL OPERATIVE REPORT   DATE OF PROCEDURE: 06/21/2017  ATTENDING Surgeon(s): Ancil Linsey, MD  ASSISTANT(S): Genia Plants, PA-S   ANESTHESIA: GETA  PRE-OPERATIVE DIAGNOSIS: Symptomatic Cholelithiasis (K80.20)  POST-OPERATIVE DIAGNOSIS: Chronic cholecystitis (K81.1)  PROCEDURE(S): (cpt's: 47562) 1.) Laparoscopic Cholecystectomy  INTRAOPERATIVE FINDINGS: Severe peri-cholecystic inflammation, intra-hepatic gallbladder with spillage of thick biliary sludge and uncomplicated recovery of 3 moderately large gallstones  INTRAOPERATIVE FLUIDS: 800 mL crystalloid   ESTIMATED BLOOD LOSS: Minimal (<30 mL)   URINE OUTPUT: No foley  SPECIMENS: Gallbladder  IMPLANTS: None  DRAINS: None   COMPLICATIONS: None apparent   CONDITION AT COMPLETION: Hemodynamically stable and extubated  DISPOSITION: PACU   INDICATION(S) FOR PROCEDURE:  Patient is a 26 y.o. female who previously presented with post-prandial RUQ > epigastric abdominal pain after eating fatty foods in particular. Ultrasound suggested cholelithiasis. Since eliminating high fat foods from her diet, patient reported complete resolution of her presenting symptoms without need for any medication, but she expressed a desire to be able to eat without fear of pain or problems is she eats a food with higher fat content. All risks, benefits, and alternatives to above elective procedures were discussed with the patient, who elected to proceed, and informed consent was accordingly obtained at that time.   DETAILS OF PROCEDURE:  Patient was brought to the operating suite and appropriately identified. General anesthesia was administered along with peri-operative prophylactic IV antibiotics, and endotracheal intubation was performed by anesthesiologist, along with NG/OG tube for gastric decompression. In supine position, operative site was prepped and draped in usual sterile fashion, and following a brief time out, initial 5 mm incision  was made in a natural skin crease just above the umbilicus. Fascia was then elevated, and a Verress needle was inserted and its proper position confirmed using aspiration and saline meniscus test.  Upon insufflation of the abdominal cavity with carbon dioxide to a well-tolerated pressure of 12-15 mmHg, 5 mm peri-umbilical port followed by laparoscope were inserted and used to inspect the abdominal cavity and its contents with no injuries from insertion of the first trochar noted. Three additional trocars were inserted, one at the epigastric position (10 mm) and two along the Right costal margin (5 mm). The table was then placed in reverse Trendelenburg position with the Right side up. Filmy adhesions between the gallbladder and omentum/duodenum/transverse colon were lysed using combined blunt and sharp dissection. The apex/dome of the gallbladder was grasped with an atraumatic grasper passed through the lateral port and retracted apically over the liver. The infundibulum was also grasped and retracted, exposing Calot's triangle. The peritoneum overlying the gallbladder infundibulum was incised and dissected free of surrounding peritoneal attachments, revealing the cystic duct and cystic artery, which were clipped twice on the patient side and once on the gallbladder specimen side close to the gallbladder. The gallbladder was then dissected from its peritoneal attachments to the liver using electrocautery, and the gallbladder was placed into a laparoscopic specimen bag and removed from the abdominal cavity via the epigastric port site. Hemostasis and secure placement of clips were confirmed, and intra-peritoneal cavity was inspected with no additional findings. PMI laparoscopic fascial closure device was then used to re-approximate fascia at the 10 mm epigastric port site.  All ports were then removed under direct visualization, and abdominal cavity was desuflated. All port sites were irrigated/cleaned,  additional local anesthetic was injected at each incision, 3-0 Vicryl was used to re-approximate dermis at 10 mm port site(s), and subcuticular 4-0 Monocryl suture was used to  re-approximate skin. Skin was then cleaned, dried, and sterile skin glue was applied. Patient was then safely able to be awakened, extubated, and transferred to PACU for post-operative monitoring and care.   I was present for all aspects of procedure, and there were no intra-operative complications apparent.

## 2017-06-21 NOTE — Anesthesia Postprocedure Evaluation (Signed)
Anesthesia Post Note  Patient: Briana Barber  Procedure(s) Performed: Procedure(s) (LRB): LAPAROSCOPIC CHOLECYSTECTOMY (N/A)  Patient location during evaluation: PACU Anesthesia Type: General Level of consciousness: awake and alert Pain management: pain level controlled Vital Signs Assessment: post-procedure vital signs reviewed and stable Respiratory status: spontaneous breathing, nonlabored ventilation, respiratory function stable and patient connected to nasal cannula oxygen Cardiovascular status: blood pressure returned to baseline and stable Postop Assessment: no signs of nausea or vomiting Anesthetic complications: no     Last Vitals:  Vitals:   06/21/17 1106 06/21/17 1156  BP: (!) 129/92 126/84  Pulse: 86 88  Resp: 16   Temp: (!) 35.9 C   SpO2: 97% 97%    Last Pain:  Vitals:   06/21/17 1215  TempSrc:   PainSc: 5                  Landy Dunnavant S

## 2017-06-21 NOTE — Discharge Instructions (Signed)
In addition to included general post-operative instructions for Laparoscopic Cholecystectomy,  Diet: Resume home heart healthy diet.   Activity: No heavy lifting >20 pounds (children, pets, laundry, garbage) or strenuous activity until follow-up, but light activity and walking are encouraged. Do not drive or drink alcohol if taking narcotic pain medications.  Wound care: 2 days after surgery (Friday, 9/14), may shower/get incision wet with soapy water and pat dry (do not rub incisions), but no baths or submerging incision underwater until follow-up.   Medications: Resume all home medications. For mild to moderate pain: acetaminophen (Tylenol) or ibuprofen (if no kidney disease). Combining Tylenol with alcohol can substantially increase your risk of causing liver disease. Narcotic pain medications, if prescribed, can be used for severe pain, though may cause nausea, constipation, and drowsiness. Do not combine Tylenol and Percocet within a 6 hour period as Percocet contains Tylenol. If you do not need the narcotic pain medication, you do not need to fill the prescription.  Call office [phone]) at any time if any questions, worsening pain, fevers/chills, bleeding, drainage from incision site, or other concerns.

## 2017-06-22 ENCOUNTER — Telehealth: Payer: Self-pay

## 2017-06-22 LAB — SURGICAL PATHOLOGY

## 2017-06-22 NOTE — Telephone Encounter (Signed)
Post-op call made to patient at this time. Spoke with Patient. Post-op interview questions below.  1. How are you feeling? Pretty Good, just sore.  2. Is your pain controlled? Yes  3. What are you doing for the pain? Taking Ibuprofen and Tylenol  4. Are you having any Nausea or Vomiting? No  5. Are you having any Fever or Chills? No  6. Are you having any Constipation or Diarrhea? No, I have not used the bathroom since surgery yet.  7. Is there any Swelling or Bruising you are concerned about? None  8. Do you have any questions or concerns at this time? No   Discussion: Reviewed post-op appointment information with patient. Also, spoke with her about Miralax should she need it if no bowel movement by tomorrow. Asked patient to increase water intake to 72 ounces and increase activity to assist with bowel movement and overall post-op health.

## 2017-07-06 ENCOUNTER — Encounter: Payer: Self-pay | Admitting: Surgery

## 2017-07-06 ENCOUNTER — Ambulatory Visit (INDEPENDENT_AMBULATORY_CARE_PROVIDER_SITE_OTHER): Payer: 59 | Admitting: Surgery

## 2017-07-06 VITALS — BP 117/81 | HR 88 | Temp 98.3°F | Ht 64.0 in | Wt 217.2 lb

## 2017-07-06 DIAGNOSIS — K811 Chronic cholecystitis: Secondary | ICD-10-CM

## 2017-07-06 NOTE — Progress Notes (Signed)
Surgical Clinic Progress/Follow-up Note   HPI:  26 y.o. Female presents to clinic for post-op follow-up evaluation s/p laparoscopic cholecystectomy for symptomatic cholelithiasis with chronic calculous cholecystitis. Patient reports complete resolution of pre-operative post-prandial RUQ abdominal pain and has been tolerating regular diet with +flatus and normal BM's, denies N/V, fever/chills, CP, or SOB.  Review of Systems:  Constitutional: denies any other weight loss, fever, chills, or sweats  Eyes: denies any other vision changes, history of eye injury  ENT: denies sore throat, hearing problems  Respiratory: denies shortness of breath, wheezing  Cardiovascular: denies chest pain, palpitations  Gastrointestinal: abdominal pain, N/V, and bowel function as per HPI Musculoskeletal: denies any other joint pains or cramps  Skin: Denies any other rashes or skin discolorations  Neurological: denies any other headache, dizziness, weakness  Psychiatric: denies any other depression, anxiety  All other review of systems: otherwise negative   Vital Signs:  BP 117/81   Pulse 88   Temp 98.3 F (36.8 C) (Oral)   Ht  (1.626 m)   Wt 217 lb 3.2 oz (98.5 kg)   BMI 37.28 kg/m    Physical Exam:  Constitutional:  -- Overweight body habitus  -- Awake, alert, and oriented x3  Eyes:  -- Pupils equally round and reactive to light  -- No scleral icterus  Ear, nose, throat:  -- No jugular venous distension  -- No nasal drainage, bleeding Pulmonary:  -- No crackles -- Equal breath sounds bilaterally -- Breathing non-labored at rest Cardiovascular:  -- S1, S2 present  -- No pericardial rubs  Gastrointestinal:  -- Soft, nontender, non-distended, no guarding/rebound -- Incisions well-approximated without surrounding erythema or drainage -- No abdominal masses appreciated, pulsatile or otherwise  Musculoskeletal / Integumentary:  -- Wounds or skin discoloration: None appreciated except  post-surgical wounds as described above (GI) -- Extremities: B/L UE and LE FROM, hands and feet warm, no edema  Neurologic:  -- Motor function: intact and symmetric  -- Sensation: intact and symmetric   Assessment:  26 y.o. yo Female with a problem list including...  Patient Active Problem List   Diagnosis Date Noted  . Chronic cholecystitis   . Right upper quadrant abdominal pain 01/18/2017  . Cholelithiasis     presents to clinic for post-op follow-up evaluation, doing well s/p laparoscopic cholecystectomy for symptomatic cholelithiasis with chronic calculous cholecystitis.  Plan:              - advance diet as tolerated              - okay to submerge incisions under water (baths, swimming) prn             - gradually resume all activities without restrictions over next 2 weeks             - apply sunblock particularly to incisions with sun exposure to reduce pigmentation of scars             - return to clinic as needed, instructed to call office if any questions or concerns   All of the above recommendations were discussed with the patient, and all of patient's questions were answered to her expressed satisfaction.  -- Scherrie Gerlach Earlene Plater, MD, RPVI [place]: [facility] - Partnering for exceptional care. Office: [phone]

## 2017-07-06 NOTE — Patient Instructions (Signed)

## 2017-10-08 IMAGING — US US ABDOMEN LIMITED
1 series · 14 of 25 positions shown · non-contrast
Comparison: 12/23/2016

CLINICAL DATA: Abdominal pain for 1 day.

EXAM:
ULTRASOUND ABDOMEN LIMITED RIGHT UPPER QUADRANT

[Series 1: us abdomen limited · 0.19mm/px · 14 of 50 slices shown]
[im 1/50]
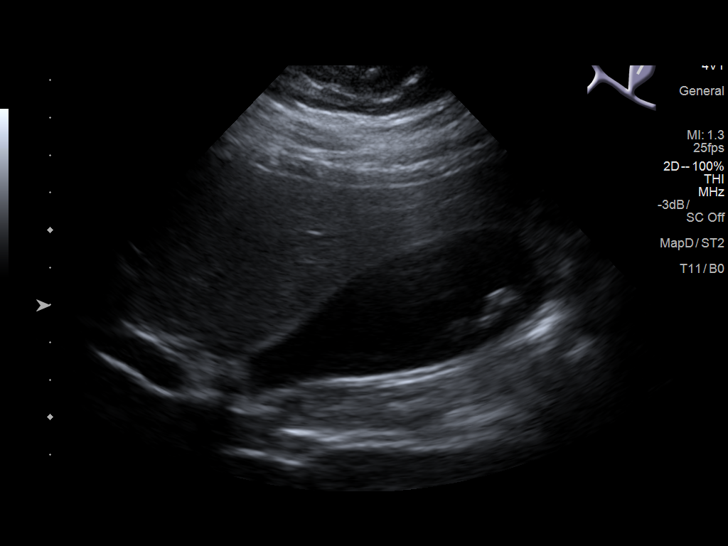
[im 5/50]
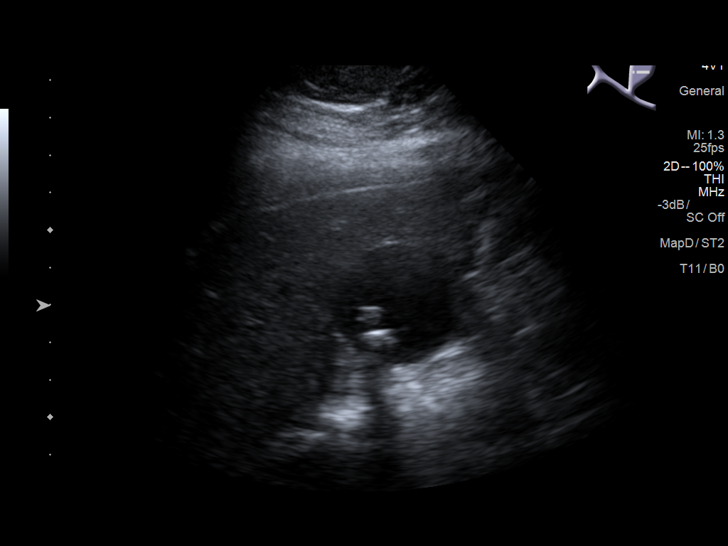
[im 9/50]
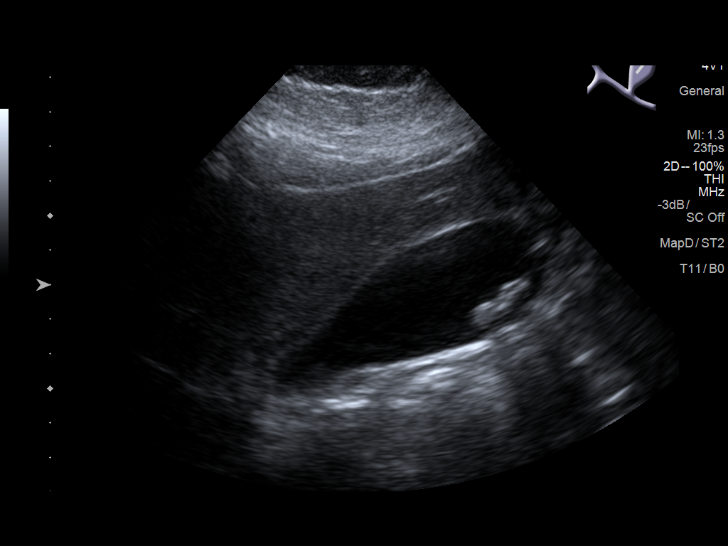
[im 13/50]
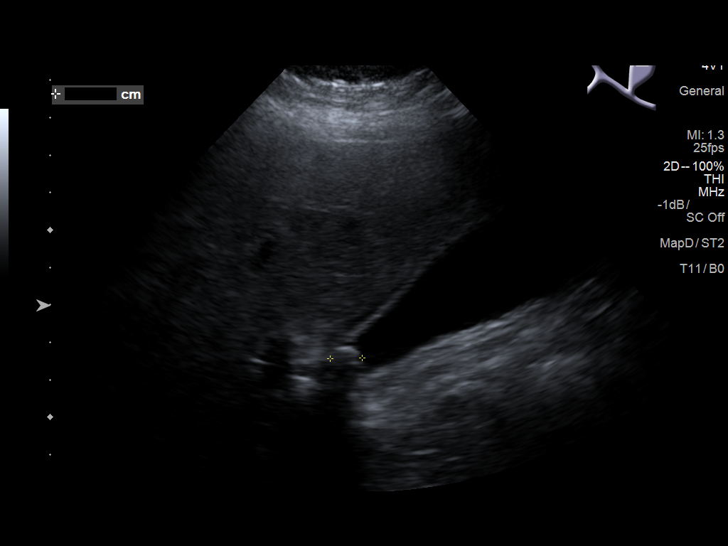
[im 17/50]
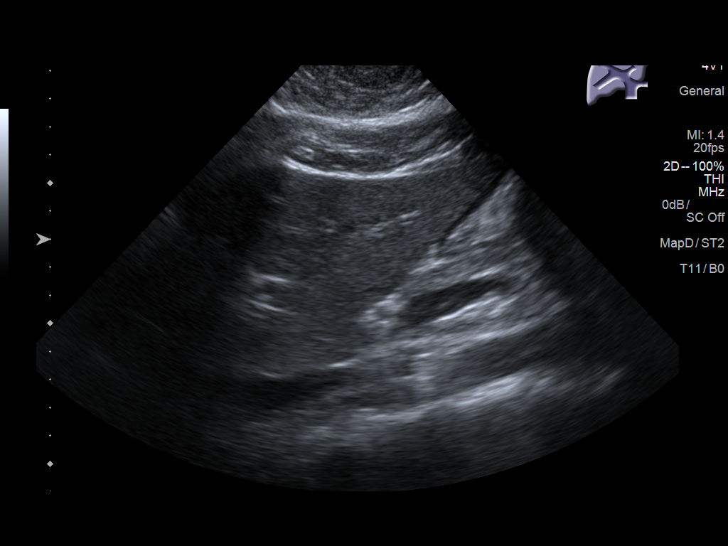
[im 19/50]
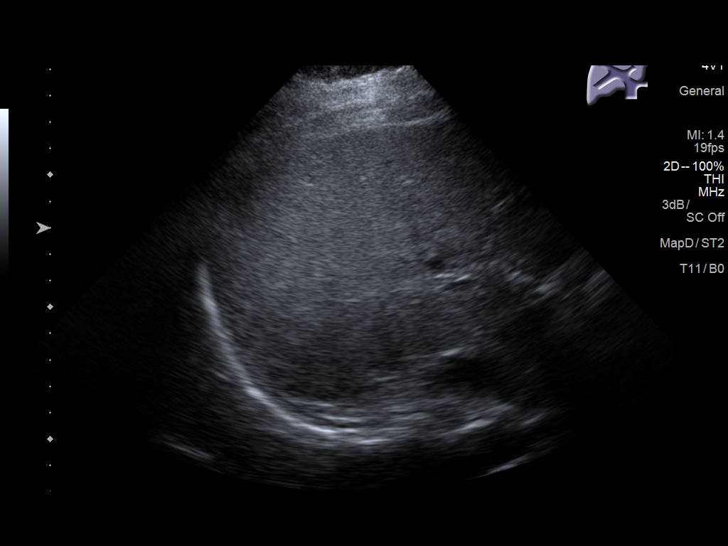
[im 23/50]
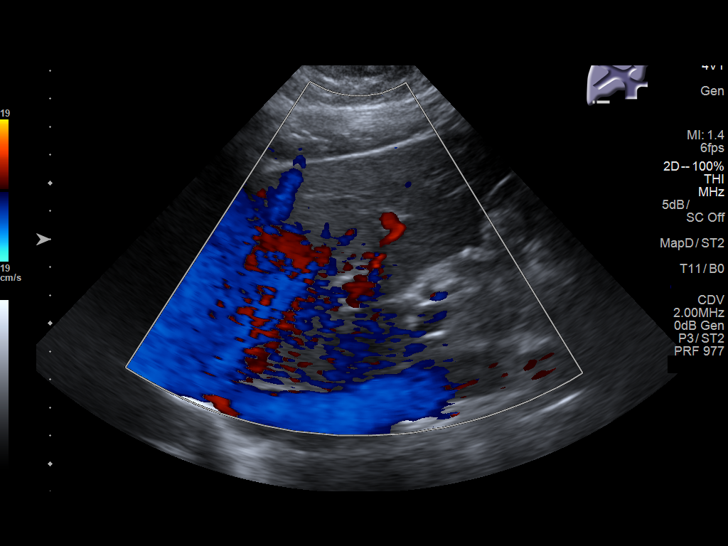
[im 27/50]
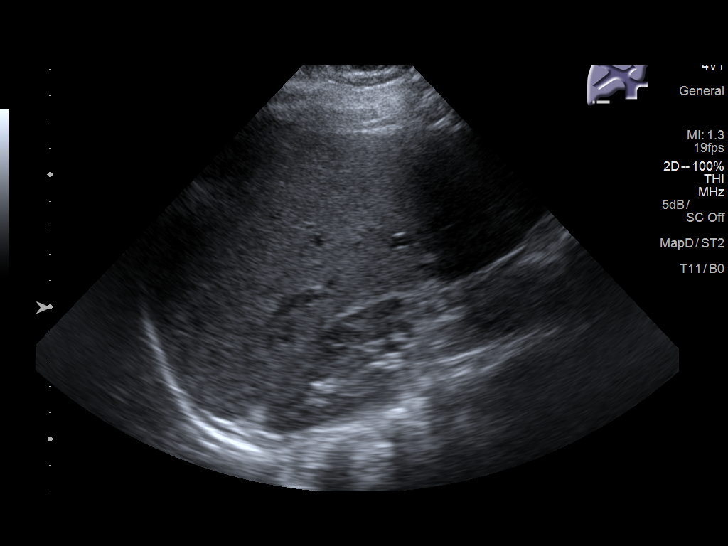
[im 31/50]
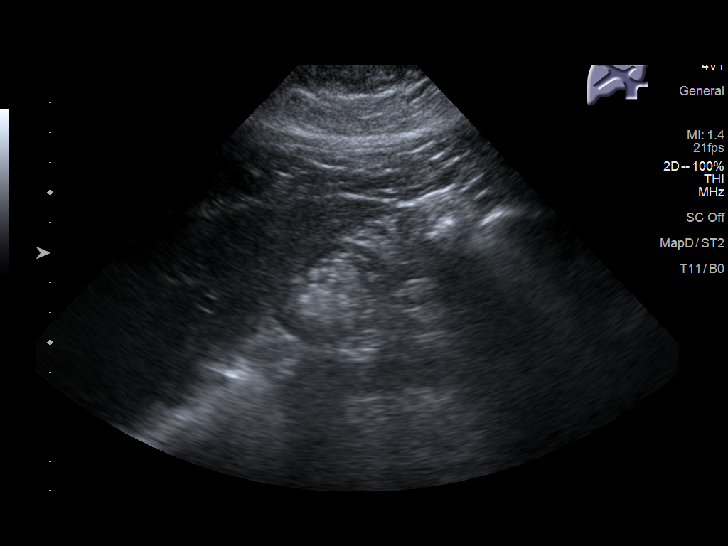
[im 33/50]
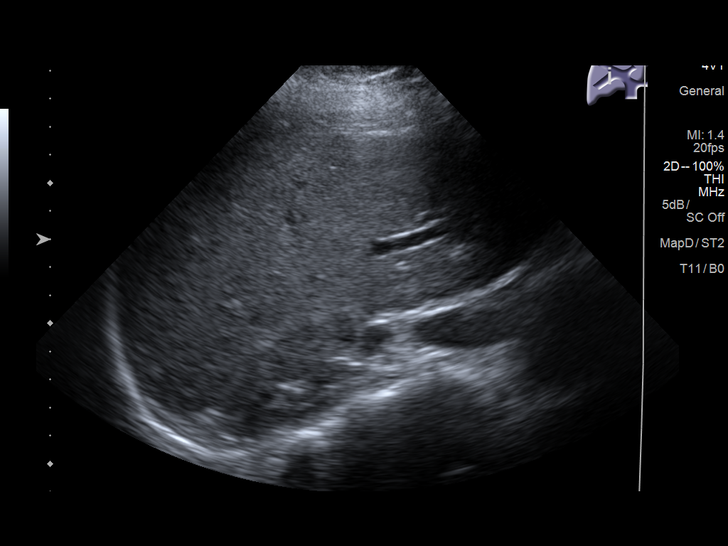
[im 37/50]
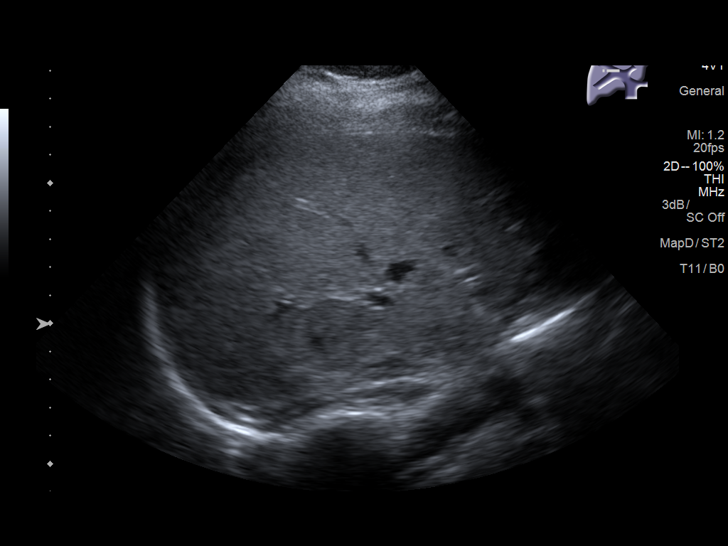
[im 41/50]
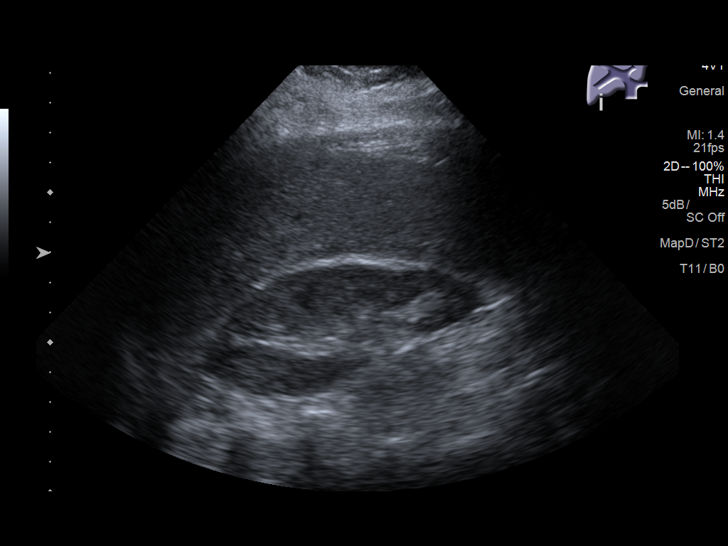
[im 45/50]
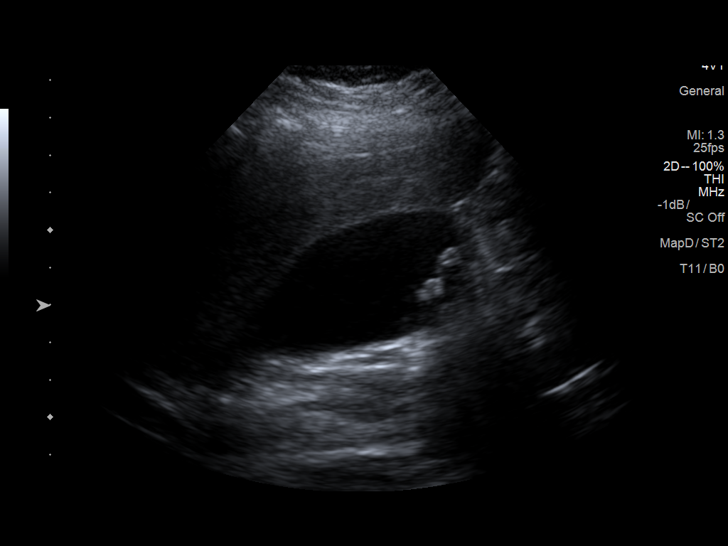
[im 50/50]
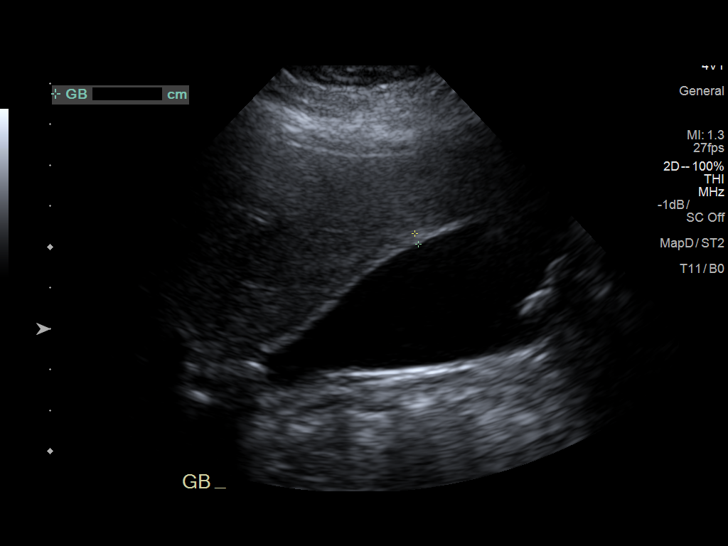

[14 of 25 positions shown; findings below may reference images not displayed]

FINDINGS: Gallbladder:

Multiple gallbladder calculi, measuring up to 1.8 cm. The patient
was tender to probe pressure over the gallbladder. No gallbladder
mural thickening or pericholecystic fluid.

Common bile duct:

Diameter: Normal, 3.3 mm.

Liver:

No focal lesion identified. Within normal limits in parenchymal
echogenicity. Portal vein is patent on color Doppler imaging with
normal direction of blood flow towards the liver.
IMPRESSION: Cholelithiasis. Positive sonographic Murphy's sign. The findings are
concerning for possible cholecystitis; however, there is no mural
thickening or pericholecystic fluid.

## 2021-02-23 ENCOUNTER — Ambulatory Visit (INDEPENDENT_AMBULATORY_CARE_PROVIDER_SITE_OTHER): Payer: 59 | Admitting: Nurse Practitioner

## 2021-02-23 ENCOUNTER — Other Ambulatory Visit: Payer: Self-pay

## 2021-02-23 ENCOUNTER — Encounter (HOSPITAL_BASED_OUTPATIENT_CLINIC_OR_DEPARTMENT_OTHER): Payer: Self-pay | Admitting: Nurse Practitioner

## 2021-02-23 VITALS — BP 149/93 | HR 103 | Ht 64.0 in | Wt 271.6 lb

## 2021-02-23 DIAGNOSIS — Z7689 Persons encountering health services in other specified circumstances: Secondary | ICD-10-CM | POA: Insufficient documentation

## 2021-02-23 DIAGNOSIS — R030 Elevated blood-pressure reading, without diagnosis of hypertension: Secondary | ICD-10-CM | POA: Insufficient documentation

## 2021-02-23 DIAGNOSIS — Z0000 Encounter for general adult medical examination without abnormal findings: Secondary | ICD-10-CM | POA: Insufficient documentation

## 2021-02-23 DIAGNOSIS — Z13220 Encounter for screening for lipoid disorders: Secondary | ICD-10-CM

## 2021-02-23 DIAGNOSIS — Z130 Encounter for screening for diseases of the blood and blood-forming organs and certain disorders involving the immune mechanism: Secondary | ICD-10-CM

## 2021-02-23 DIAGNOSIS — Z1329 Encounter for screening for other suspected endocrine disorder: Secondary | ICD-10-CM | POA: Diagnosis not present

## 2021-02-23 DIAGNOSIS — Z23 Encounter for immunization: Secondary | ICD-10-CM | POA: Insufficient documentation

## 2021-02-23 DIAGNOSIS — Z131 Encounter for screening for diabetes mellitus: Secondary | ICD-10-CM | POA: Diagnosis not present

## 2021-02-23 DIAGNOSIS — N926 Irregular menstruation, unspecified: Secondary | ICD-10-CM

## 2021-02-23 DIAGNOSIS — Z1321 Encounter for screening for nutritional disorder: Secondary | ICD-10-CM

## 2021-02-23 DIAGNOSIS — Z6841 Body Mass Index (BMI) 40.0 and over, adult: Secondary | ICD-10-CM

## 2021-02-23 DIAGNOSIS — Z1159 Encounter for screening for other viral diseases: Secondary | ICD-10-CM

## 2021-02-23 DIAGNOSIS — Z114 Encounter for screening for human immunodeficiency virus [HIV]: Secondary | ICD-10-CM

## 2021-02-23 DIAGNOSIS — Z13228 Encounter for screening for other metabolic disorders: Secondary | ICD-10-CM

## 2021-02-23 HISTORY — DX: Encounter for immunization: Z23

## 2021-02-23 HISTORY — DX: Encounter for screening for lipoid disorders: Z13.220

## 2021-02-23 HISTORY — DX: Encounter for screening for diabetes mellitus: Z13.1

## 2021-02-23 HISTORY — DX: Encounter for screening for other suspected endocrine disorder: Z13.0

## 2021-02-23 NOTE — Assessment & Plan Note (Addendum)
Assessment: Historically normal menses at age 31-12 with gradual decrease in monthly periods to about 7 per month Hirsutism present on examination today with BMI 45.62- thyroid does feel enlarged. No OCP or other hormonal medications Consider PCOS or thyroid dysfunction Discussion/Recommendations: PCOS information explained and handout provided Suggest discussion with GYN at upcoming appt  Will obtain thyroid labs today Plan: Will consider further testing and evaluation if not performed by GYN OCP may be helpful for regulation of menses. Will make changes to plan of care based on labs.

## 2021-02-23 NOTE — Assessment & Plan Note (Signed)
Assessment: BMI 46.62 today Concerns for PCOS present Thyroid enlarged Discussion/Recommendations: Diet and exercise recommendations provided Continue daily walks- recommend at least 20 min 5 days a week Plan: Will review labs  Recommend discuss with GYN about possible PCOS and recommendations

## 2021-02-23 NOTE — Patient Instructions (Addendum)
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  I would like you to monitor your blood pressure at home when you are calm and write down the numbers for me. Place your feet flat on the floor and keep your arm at the same level as your heart.  Write down the top number and bottom number and your heart rate for me.  We will follow-up in a few weeks (it can be a virtual visit) to see how it has been doing at home to make sure we don't need to do anything else about it.    I have ordered labs today as part of the physical  Labs should be fasting- nothing to eat for at least 8 hours prior to the test (black coffee and water are OK)  Our lab is located on the GROUND floor of this building. Exit the elevators to the left and enter the glass doors straight ahead for the lab  Our lab requires an appointment time, our front office staff can schedule this for you- if you need to call back to schedule, you can do that- same day scheduling is available.  I have ordered CBC, CMP, Lipids, Thyroid Panel, Hemoglobin A1c, One time HIV, and One time Hepatitis C testing.  These tests monitor liver function, kidney function, electrolytes, blood counts, your thyroid, blood sugars, and one time testing for all adults with low risk of hepatitis C and hiv.    We don't have any records available for you so we want to make sure you are up to date on your necessary health maintenance recommendations. Based on this, it appears you are due for:  HPV Vaccines (3 dose series) (information below)  Pap Smear with HPV testing (information below)  I recommend that all adults receive a Flu vaccine in the Fall of every year- usually around October. You can come have this done here or at a local pharmacy, usually free of charge with insurance. If you have this done, please let us know so  we can update our records.  If you see another provider, please ask them to send their records to [place] so we can update any of your health information.  Information on diet, exercise, and health maintenance recommendations are listed below. This is information to help you be sure you are on track for optimal health and monitoring. Please look over this and let us know if you have any questions or if you have completed any of the health maintenance outside of [place] so that we can be sure your records are up to date.  ___________________________________________________________  Thank you for choosing [facility] for your Primary Care needs. I am excited for the opportunity to partner with you to meet your health care goals. It was a pleasure meeting you today!  I am an Adult-Geriatric Nurse Practitioner with a background in caring for patients for more than 20 years. I received my Paediatric nurse in Nursing and my Doctor of Nursing Practice degrees at [employer]. I received additional fellowship training in primary care and sports medicine after receiving my doctorate degree. I provide primary care and sports medicine services to patients age 70 and older within this office. I am also a provider with the [facility] and the director of the APP Fellowship with [facility].  I am a [place] native, but have called the [place] area home for nearly 20 years and am proud to be a member of this community.   I am passionate about providing the best service to you through preventive medicine and supportive care. I consider you a part of the medical team and value your input. I work diligently to ensure that you are heard and your needs are met in a safe and effective manner. I want you to feel comfortable with me as your provider and want you to know that your health concerns are important to me.   For your information, our  office hours are Monday- Friday 8:00 AM - 5:00 PM At this time I am not in the office on Wednesdays.  If you have questions or concerns, please call our office at [phone] or send [place] a MyChart message and we will respond as quickly as possible.   For all urgent or time sensitive needs we ask that you please call the office to avoid delays. MyChart is not constantly monitored and replies may take up to 72 business hours.  MyChart Policy: . MyChart allows for you to see your visit notes, after visit summary, provider recommendations, lab and tests results, make an appointment, request refills, and contact your provider or the office for non-urgent questions or concerns.  . Providers are seeing patients during normal business hours and do not have built in time to review MyChart messages. We ask that you allow a minimum of 72 business hours for MyChart message responses.  . Complex MyChart concerns may require a visit. Your provider may request you schedule a virtual or in person visit to ensure we are providing the best care possible. . MyChart messages sent after 4:00 PM on Friday will not be received by the provider until Monday morning.    Lab and Test Results: . You will receive your lab and test results on MyChart as soon as they are completed and results have been sent by the lab or testing facility. Due to this service, you will receive your results BEFORE your provider.  . Please allow a minimum of 72 business hours for your provider to receive and review lab and test results and contact you about.   . Most lab and test result comments from the provider will be sent through [place]. Your provider may recommend changes to the plan of care, follow-up visits, repeat testing, ask questions, or request an office visit to discuss these results. You may reply directly to this message or call the office at [phone] to provide information for the provider or set up an appointment. . In some  instances, you will be called with test results and recommendations. Please let us know if this is preferred and we will make note of this in your chart to provide this for you.    . If you have not heard a response to your lab or test results in 72 business hours, please call the office to let us know.   After Hours: . For all non-emergency after hours needs, please call the office at [phone] and select the option to reach the on-call provider service. On-call services are shared between multiple [place] offices and therefore it will not be possible to speak directly with your provider. On-call providers may provide medical advice and recommendations, but are unable to provide refills for maintenance medications.  . For all emergency or urgent medical needs after normal business hours, we recommend  that you seek care at the closest Urgent Care or Emergency Department to ensure appropriate treatment in a timely manner.  [facility] has a 24 hour emergency room located on the ground floor for your convenience.    Please do not hesitate to reach out to [place] with concerns.   Thank you, again, for choosing me as your health care partner. I appreciate your trust and look forward to learning more about you.   Worthy Keeler, DNP, AGNP-c ___________________________________________________________  Health Maintenance Recommendations Screening Testing  Mammogram  Every 1 -2 years based on history and risk factors  Starting at age 3  Pap Smear  Ages 21-39 every 3 years  Ages 91-65 every 5 years with HPV testing  More frequent testing may be required based on results and history  Colon Cancer Screening  Every 1-10 years based on test performed, risk factors, and history  Starting at age 78  Bone Density Screening  Every 2-10 years based on history  Starting at age 56 for women  Recommendations for men differ based on medication usage, history, and  risk factors  AAA Screening  One time ultrasound  Men 43-33 years old who have every smoked  Lung Cancer Screening  Low Dose Lung CT every 12 months  Age 46-80 years with a 30 pack-year smoking history who still smoke or who have quit within the last 15 years  Screening Labs  Routine  Labs: Complete Blood Count (CBC), Complete Metabolic Panel (CMP), Cholesterol (Lipid Panel)  Every 6-12 months based on history and medications  May be recommended more frequently based on current conditions or previous results  Hemoglobin A1c Lab  Every 3-12 months based on history and previous results  Starting at age 49 or earlier with diagnosis of diabetes, high cholesterol, BMI >26, and/or risk factors  Frequent monitoring for patients with diabetes to ensure blood sugar control  Thyroid Panel (TSH w/ T3 & T4)  Every 6 months based on history, symptoms, and risk factors  May be repeated more often if on medication  HIV  One time testing for all patients 62 and older  May be repeated more frequently for patients with increased risk factors or exposure  Hepatitis C  One time testing for all patients 27 and older  May be repeated more frequently for patients with increased risk factors or exposure  Gonorrhea, Chlamydia  Every 12 months for all sexually active persons 13-24 years  Additional monitoring may be recommended for those who are considered high risk or who have symptoms  PSA  Men 82-41 years old with risk factors  Additional screening may be recommended from age 70-69 based on risk factors, symptoms, and history  Vaccine Recommendations  Tetanus Booster  All adults every 10 years  Flu Vaccine  All patients 6 months and older every year  COVID Vaccine  All patients 12 years and older  Initial dosing with booster  May recommend additional booster based on age and health history  HPV Vaccine  2 doses all patients age 16-26  Dosing may be considered  for patients over 26  Shingles Vaccine (Shingrix)  2 doses all adults 35 years and older  Pneumonia (Pneumovax 23)  All adults 60 years and older  May recommend earlier dosing based on health history  Pneumonia (Prevnar 34)  All adults 59 years and older  Dosed 1 year after Pneumovax 23  Additional Screening, Testing, and Vaccinations may be recommended on an individualized basis based  on family history, health history, risk factors, and/or exposure.  __________________________________________________________  Diet Recommendations for All Patients  I recommend that all patients maintain a diet low in saturated fats, carbohydrates, and cholesterol. While this can be challenging at first, it is not impossible and small changes can make big differences.  Things to try: Marland Kitchen Decreasing the amount of soda, sweet tea, and/or juice to one or less per day and replace with water o While water is always the first choice, if you do not like water you may consider - adding a water additive without sugar to improve the taste - other sugar free drinks . Replace potatoes with a brightly colored vegetable at dinner . Use healthy oils, such as canola oil or olive oil, instead of butter or hard margarine . Limit your bread intake to two pieces or less a day . Replace regular pasta with low carb pasta options . Bake, broil, or grill foods instead of frying . Monitor portion sizes  . Eat smaller, more frequent meals throughout the day instead of large meals  An important thing to remember is, if you love foods that are not great for your health, you don't have to give them up completely. Instead, allow these foods to be a reward when you have done well. Allowing yourself to still have special treats every once in a while is a nice way to tell yourself thank you for working hard to keep yourself healthy.   Also remember that every day is a new day. If you have a bad day and "fall off the wagon", you  can still climb right back up and keep moving along on your journey!  We have resources available to help you!  Some websites that may be helpful include: . www.http://carter.biz/  . Www.VeryWellFit.com _____________________________________________________________  Activity Recommendations for All Patients  I recommend that all adults get at least 20 minutes of moderate physical activity that elevates your heart rate at least 5 days out of the week.  Some examples include: . Walking or jogging at a pace that allows you to carry on a conversation . Cycling (stationary bike or outdoors) . Water aerobics . Yoga . Weight lifting . Dancing If physical limitations prevent you from putting stress on your joints, exercise in a pool or seated in a chair are excellent options.  Do determine your MAXIMUM heart rate for activity: YOUR AGE - 220 = MAX HeartRate   Remember! . Do not push yourself too hard.  . Start slowly and build up your pace, speed, weight, time in exercise, etc.  . Allow your body to rest between exercise and get good sleep. . You will need more water than normal when you are exerting yourself. Do not wait until you are thirsty to drink. Drink with a purpose of getting in at least 8, 8 ounce glasses of water a day plus more depending on how much you exercise and sweat.    If you begin to develop dizziness, chest pain, abdominal pain, jaw pain, shortness of breath, headache, vision changes, lightheadedness, or other concerning symptoms, stop the activity and allow your body to rest. If your symptoms are severe, seek emergency evaluation immediately. If your symptoms are concerning, but not severe, please let us know so that we can recommend further evaluation.   ________________________________________________________________ Tdap (Tetanus, Diphtheria, Pertussis) Vaccine: What You Need to Know 1. Why get vaccinated? Tdap vaccine can prevent tetanus, diphtheria, and  pertussis. Diphtheria and pertussis spread from person to  person. Tetanus enters the body through cuts or wounds.  TETANUS (T) causes painful stiffening of the muscles. Tetanus can lead to serious health problems, including being unable to open the mouth, having trouble swallowing and breathing, or death.  DIPHTHERIA (D) can lead to difficulty breathing, heart failure, paralysis, or death.  PERTUSSIS (aP), also known as "whooping cough," can cause uncontrollable, violent coughing that makes it hard to breathe, eat, or drink. Pertussis can be extremely serious especially in babies and young children, causing pneumonia, convulsions, brain damage, or death. In teens and adults, it can cause weight loss, loss of bladder control, passing out, and rib fractures from severe coughing. 2. Tdap vaccine Tdap is only for children 7 years and older, adolescents, and adults.  Adolescents should receive a single dose of Tdap, preferably at age 13 or 81 years. Pregnant people should get a dose of Tdap during every pregnancy, preferably during the Shizue Kaseman part of the third trimester, to help protect the newborn from pertussis. Infants are most at risk for severe, life-threatening complications from pertussis. Adults who have never received Tdap should get a dose of Tdap. Also, adults should receive a booster dose of either Tdap or Td (a different vaccine that protects against tetanus and diphtheria but not pertussis) every 10 years, or after 5 years in the case of a severe or dirty wound or burn. Tdap may be given at the same time as other vaccines. 3. Talk with your health care provider Tell your vaccine provider if the person getting the vaccine:  Has had an allergic reaction after a previous dose of any vaccine that protects against tetanus, diphtheria, or pertussis, or has any severe, life-threatening allergies  Has had a coma, decreased level of consciousness, or prolonged seizures within 7 days after a  previous dose of any pertussis vaccine (DTP, DTaP, or Tdap)  Has seizures or another nervous system problem  Has ever had Guillain-Barr Syndrome (also called "GBS")  Has had severe pain or swelling after a previous dose of any vaccine that protects against tetanus or diphtheria In some cases, your health care provider may decide to postpone Tdap vaccination until a future visit. People with minor illnesses, such as a cold, may be vaccinated. People who are moderately or severely ill should usually wait until they recover before getting Tdap vaccine.  Your health care provider can give you more information. 4. Risks of a vaccine reaction  Pain, redness, or swelling where the shot was given, mild fever, headache, feeling tired, and nausea, vomiting, diarrhea, or stomachache sometimes happen after Tdap vaccination. People sometimes faint after medical procedures, including vaccination. Tell your provider if you feel dizzy or have vision changes or ringing in the ears.  As with any medicine, there is a very remote chance of a vaccine causing a severe allergic reaction, other serious injury, or death. 5. What if there is a serious problem? An allergic reaction could occur after the vaccinated person leaves the clinic. If you see signs of a severe allergic reaction (hives, swelling of the face and throat, difficulty breathing, a fast heartbeat, dizziness, or weakness), call 9-1-1 and get the person to the nearest hospital. For other signs that concern you, call your health care provider.  Adverse reactions should be reported to the Vaccine Adverse Event Reporting System (VAERS). Your health care provider will usually file this report, or you can do it yourself. Visit the VAERS website at www.vaers.SamedayNews.es or call (817)472-7230. VAERS is only for reporting reactions, and  VAERS staff members do not give medical advice. 6. The National Vaccine Injury Compensation Program The [employer] Vaccine Injury  Compensation Program (VICP) is a federal program that was created to compensate people who may have been injured by certain vaccines. Claims regarding alleged injury or death due to vaccination have a time limit for filing, which may be as short as two years. Visit the VICP website at GoldCloset.com.ee or call 814-181-1272 to learn about the program and about filing a claim. 7. How can I learn more?  Ask your health care provider.  Call your local or state health department.  Visit the website of the Food and Drug Administration (FDA) for vaccine package inserts and additional information at TraderRating.uy.  Contact the Centers for Disease Control and Prevention (CDC): ? Call 787-078-6677 (1-800-CDC-INFO) or ? Visit CDC's website at http://hunter.com/. Vaccine Information Statement Tdap (Tetanus, Diphtheria, Pertussis) Vaccine (05/15/2020) This information is not intended to replace advice given to you by your health care provider. Make sure you discuss any questions you have with your health care provider. Document Revised: 06/10/2020 Document Reviewed: 06/10/2020 Elsevier Patient Education  2021 [place].   HPV and Cancer Information HPV (human papillomavirus)is a very common virus that spreads easily from person to person through skin-to-skin or sexual contact. There are many types of HPV. It often does not cause symptoms. However, depending upon the type, it may sometimes cause warts in the genitals (genital or mucosal HPV), or on the hands or feet (cutaneous or nonmucosal HPV). It is possible to be infected for a long time and pass HPV to others without knowing it. Some HPV infections go away on their own within 2 years, but other HPV infections are considered high-risk and may cause changes in cells that could lead to cancer. You can take steps to avoid HPV infection and to lower your risk of getting cancer. How can HPV affect  me? HPV can cause warts in the genitals or on the hands or feet. It can also cause wart-like lesions in the throat.  Certain types of genital HPV can also cause cancer, which may include:  Cervical cancer.  Vaginal cancer.  Vulvar cancer.  Anal cancer.  Throat cancer.  Tongue or mouth cancer.  Penile cancer. How does HPV spread? HPV spreads easily through direct person to person contact. Genital HPV spreads through sexual contact. You can get HPV from vaginal sex, oral sex, anal sex, or just by touching someone's genitals. Even people who have only one sexual partner may have HPV because that partner may have it. HPV often does not cause symptoms, so most infected people do not know that they have it. What actions can I take to prevent HPV? Take the following steps to help prevent HPV infection:  Talk with your health care provider about getting the HPV vaccine. This vaccine protects against the types of HPV that could cause cancer.  Limit the number of people you have sex with. Also, avoid having sex with people who have had many sexual partners.  Use a condom during sex.  Talk with your sexual partners about their health.   What actions can I take to lower my risk for cancer? Having a healthy lifestyle and taking some preventive steps can help lower your cancer risk, whether or not you have genital HPV. Some steps you can take include: Lifestyle  Practice safe sex to help prevent HPV infection.  Do not use any products that contain nicotine or tobacco, such as cigarettes, e-cigarettes,  and chewing tobacco. If you need help quitting, ask your health care provider.  Eat foods that have antioxidants, such as fruits, vegetables, and grains. Try to eat at least 5 servings of fruits and vegetables every day.  Get regular exercise.  Lose weight if you are overweight.  Practice good oral hygiene. This includes flossing and brushing your teeth every day. Other preventive  steps  Get the HPV vaccine as told by your health care provider.  Get tested for STIs even if you do not have symptoms of HPV. You may have HPV and not know it.  If you are a woman, get regular Pap and HPV tests. Talk with your health care provider about how often you need these tests. Pap tests will help identify changes in cells that can lead to cancer. HPV tests will help identify the presence of HPV in cells in the cervix. Where to find more information Learn more about HPV and cancer from:  Centers for Disease Control and Prevention: http://sweeney-todd.com/  Brush: www.cancer.gov  American Cancer Society: www.cancer.org Contact a health care provider if:  You have genital warts.  You are sexually active and think you may have HPV.  You did not protect yourself during sex and would like to be tested for STIs. Summary  Human papillomavirus (HPV) is a very common virus that spreads easily from person to person and ishighly contagious.  Certain types of genital HPV are considered to be high risk and may cause changes in cells that could lead to cancer.  You should take steps to avoid HPV infection, such as limiting the number of people you have sex with, using condoms during sex, and getting the HPV vaccine.  Lifestyle changes can help lower your risk of cancer. These include eating a healthy diet, getting regular exercise, and not using any products that contain nicotine or tobacco.  You may have HPV and not know it. Get tested for STIs even if you do not have symptoms of HPV. If you are a woman, have regular Pap tests and HPV tests as directed by your health care provider. This information is not intended to replace advice given to you by your health care provider. Make sure you discuss any questions you have with your health care provider. Document Revised: 05/12/2020 Document Reviewed: 05/12/2020 Elsevier Patient Education  2021 [place].   Pap Test Why am  I having this test? A Pap test, also called a Pap smear, is a screening test to check for signs of:  Cancer of the vagina, cervix, and uterus. The cervix is the lower part of the uterus that opens into the vagina.  Infection.  Changes that may be a sign that cancer is developing (precancerous changes). Women need this test on a regular basis. In general, you should have a Pap test every 3 years until you reach menopause or age 11. Women aged 30-60 may choose to have their Pap test done at the same time as an HPV (human papillomavirus) test every 5 years (instead of every 3 years). Your health care provider may recommend having Pap tests more or less often depending on your medical conditions and past Pap test results. What kind of sample is taken? Your health care provider will collect a sample of cells from the surface of your cervix. This will be done using a small cotton swab, plastic spatula, or brush. This sample is often collected during a pelvic exam, when you are lying on your back on  an exam table with feet in footrests (stirrups). In some cases, fluids (secretions) from the cervix or vagina may also be collected.   How do I prepare for this test?  Be aware of where you are in your menstrual cycle. If you are menstruating on the day of the test, you may be asked to reschedule.  You may need to reschedule if you have a known vaginal infection on the day of the test.  Follow instructions from your health care provider about: ? Changing or stopping your regular medicines. Some medicines can cause abnormal test results, such as digitalis and tetracycline. ? Avoiding douching or taking a bath the day before or the day of the test. Tell a health care provider about:  Any allergies you have.  All medicines you are taking, including vitamins, herbs, eye drops, creams, and over-the-counter medicines.  Any blood disorders you have.  Any surgeries you have had.  Any medical conditions  you have.  Whether you are pregnant or may be pregnant. How are the results reported? Your test results will be reported as either abnormal or normal. A false-positive result can occur. A false positive is incorrect because it means that a condition is present when it is not. A false-negative result can occur. A false negative is incorrect because it means that a condition is not present when it is. What do the results mean? A normal test result means that you do not have signs of cancer of the vagina, cervix, or uterus. An abnormal result may mean that you have:  Cancer. A Pap test by itself is not enough to diagnose cancer. You will have more tests done in this case.  Precancerous changes in your vagina, cervix, or uterus.  Inflammation of the cervix.  An STD (sexually transmitted disease).  A fungal infection.  A parasite infection. Talk with your health care provider about what your results mean. Questions to ask your health care provider Ask your health care provider, or the department that is doing the test:  When will my results be ready?  How will I get my results?  What are my treatment options?  What other tests do I need?  What are my next steps? Summary  In general, women should have a Pap test every 3 years until they reach menopause or age 35.  Your health care provider will collect a sample of cells from the surface of your cervix. This will be done using a small cotton swab, plastic spatula, or brush.  In some cases, fluids (secretions) from the cervix or vagina may also be collected. This information is not intended to replace advice given to you by your health care provider. Make sure you discuss any questions you have with your health care provider. Document Revised: 06/03/2020 Document Reviewed: 05/29/2020 Elsevier Patient Education  [place].   Polycystic Ovary Syndrome  Polycystic ovarian syndrome (PCOS) is a common hormonal disorder  among women of reproductive age. In most women with PCOS, small fluid-filled sacs (cysts) grow on the ovaries. PCOS can cause problems with menstrual periods and make it hard to get and stay pregnant. If this condition is not treated, it can lead to serious health problems, such as diabetes and heart disease. What are the causes? The cause of this condition is not known. It may be due to certain factors, such as:  Irregular menstrual cycle.  High levels of certain hormones.  Problems with the hormone that helps to control blood sugar (insulin).  Certain genes. What increases the risk? You are more likely to develop this condition if you:  Have a family history of PCOS or type 2 diabetes.  Are overweight, eat unhealthy foods, and are not active. These factors may cause problems with blood sugar control, which can contribute to PCOS or PCOS symptoms. What are the signs or symptoms? Symptoms of this condition include:  Ovarian cysts and sometimes pelvic pain.  Menstrual periods that are not regular or are too heavy.  Inability to get or stay pregnant.  Increased growth of hair on the face, chest, stomach, back, thumbs, thighs, or toes.  Acne or oily skin. Acne may develop during adulthood, and it may not get better with treatment.  Weight gain or obesity.  Patches of thickened and dark brown or black skin on the neck, arms, breasts, or thighs. How is this diagnosed? This condition is diagnosed based on:  Your medical history.  A physical exam that includes a pelvic exam. Your health care provider may look for areas of increased hair growth on your skin.  Tests, such as: ? An ultrasound to check the ovaries for cysts and to view the lining of the uterus. ? Blood tests to check levels of sugar (glucose), female hormone (testosterone), and female hormones (estrogen and progesterone). How is this treated? There is no cure for this condition, but treatment can help to manage  symptoms and prevent more health problems from developing. Treatment varies depending on your symptoms and if you want to have a baby or if you need birth control. Treatment may include:  Making nutrition and lifestyle changes.  Taking the progesterone hormone to start a menstrual period.  Taking birth control pills to help you have regular menstrual periods.  Taking medicines such as: ? Medicines to make you ovulate, if you want to get pregnant. ? Medicine to reduce extra hair growth.  Having surgery in severe cases. This may involve making small holes in one or both of your ovaries. This decreases the amount of testosterone that your body makes. Follow these instructions at home:  Take over-the-counter and prescription medicines only as told by your health care provider.  Follow a healthy meal plan that includes lean proteins, complex carbohydrates, fresh fruits and vegetables, low-fat dairy products, healthy fats, and fiber.  If you are overweight, lose weight as told by your health care provider. Your health care provider can determine how much weight loss is best for you and can help you lose weight safely.  Keep all follow-up visits. This is important. Contact a health care provider if:  Your symptoms do not get better with medicine.  Your symptoms get worse or you develop new symptoms. Summary  Polycystic ovarian syndrome (PCOS) is a common hormonal disorder among women of reproductive age.  PCOS can cause problems with menstrual periods and make it hard to get and stay pregnant.  If this condition is not treated, it can lead to serious health problems, such as diabetes and heart disease.  There is no cure for this condition, but treatment can help to manage symptoms and prevent more health problems from developing. This information is not intended to replace advice given to you by your health care provider. Make sure you discuss any questions you have with your health  care provider. Document Revised: 03/05/2020 Document Reviewed: 03/05/2020 Elsevier Patient Education  2021 [place].   [email or web address]">  Generalized Anxiety Disorder, Adult Generalized anxiety disorder (GAD) is a mental health condition. Unlike normal worries,  anxiety related to GAD is not triggered by a specific event. These worries do not fade or get better with time. GAD interferes with relationships, work, and school. GAD symptoms can vary from mild to severe. People with severe GAD can have intense waves of anxiety with physical symptoms that are similar to panic attacks. What are the causes? The exact cause of GAD is not known, but the following are believed to have an impact:  Differences in natural brain chemicals.  Genes passed down from parents to children.  Differences in the way threats are perceived.  Development during childhood.  Personality. What increases the risk? The following factors may make you more likely to develop this condition:  Being female.  Having a family history of anxiety disorders.  Being very shy.  Experiencing very stressful life events, such as the death of a loved one.  Having a very stressful family environment. What are the signs or symptoms? People with GAD often worry excessively about many things in their lives, such as their health and family. Symptoms may also include:  Mental and emotional symptoms: ? Worrying excessively about natural disasters. ? Fear of being late. ? Difficulty concentrating. ? Fears that others are judging your performance.  Physical symptoms: ? Fatigue. ? Headaches, muscle tension, muscle twitches, trembling, or feeling shaky. ? Feeling like your heart is pounding or beating very fast. ? Feeling out of breath or like you cannot take a deep breath. ? Having trouble falling asleep or staying asleep, or experiencing restlessness. ? Sweating. ? Nausea, diarrhea, or irritable bowel syndrome  (IBS).  Behavioral symptoms: ? Experiencing erratic moods or irritability. ? Avoidance of new situations. ? Avoidance of people. ? Extreme difficulty making decisions. How is this diagnosed? This condition is diagnosed based on your symptoms and medical history. You will also have a physical exam. Your health care provider may perform tests to rule out other possible causes of your symptoms. To be diagnosed with GAD, a person must have anxiety that:  Is out of his or her control.  Affects several different aspects of his or her life, such as work and relationships.  Causes distress that makes him or her unable to take part in normal activities.  Includes at least three symptoms of GAD, such as restlessness, fatigue, trouble concentrating, irritability, muscle tension, or sleep problems. Before your health care provider can confirm a diagnosis of GAD, these symptoms must be present more days than they are not, and they must last for 6 months or longer. How is this treated? This condition may be treated with:  Medicine. Antidepressant medicine is usually prescribed for long-term daily control. Anti-anxiety medicines may be added in severe cases, especially when panic attacks occur.  Talk therapy (psychotherapy). Certain types of talk therapy can be helpful in treating GAD by providing support, education, and guidance. Options include: ? Cognitive behavioral therapy (CBT). People learn coping skills and self-calming techniques to ease their physical symptoms. They learn to identify unrealistic thoughts and behaviors and to replace them with more appropriate thoughts and behaviors. ? Acceptance and commitment therapy (ACT). This treatment teaches people how to be mindful as a way to cope with unwanted thoughts and feelings. ? Biofeedback. This process trains you to manage your body's response (physiological response) through breathing techniques and relaxation methods. You will work with a  therapist while machines are used to monitor your physical symptoms.  Stress management techniques. These include yoga, meditation, and exercise. A mental health specialist can help  determine which treatment is best for you. Some people see improvement with one type of therapy. However, other people require a combination of therapies.   Follow these instructions at home: Lifestyle  Maintain a consistent routine and schedule.  Anticipate stressful situations. Create a plan, and allow extra time to work with your plan.  Practice stress management or self-calming techniques that you have learned from your therapist or your health care provider. General instructions  Take over-the-counter and prescription medicines only as told by your health care provider.  Understand that you are likely to have setbacks. Accept this and be kind to yourself as you persist to take better care of yourself.  Recognize and accept your accomplishments, even if you judge them as small.  Keep all follow-up visits as told by your health care provider. This is important. Contact a health care provider if:  Your symptoms do not get better.  Your symptoms get worse.  You have signs of depression, such as: ? A persistently sad or irritable mood. ? Loss of enjoyment in activities that used to bring you joy. ? Change in weight or eating. ? Changes in sleeping habits. ? Avoiding friends or family members. ? Loss of energy for normal tasks. ? Feelings of guilt or worthlessness. Get help right away if:  You have serious thoughts about hurting yourself or others. If you ever feel like you may hurt yourself or others, or have thoughts about taking your own life, get help right away. Go to your nearest emergency department or:  Call your local emergency services (911 in the [place]  Call a suicide crisis helpline, such as the [place] at (571)316-0130. This is open 24 hours a day in the  [place]  Text the Crisis Text Line at 979-187-2410 (in the [place].). Summary  Generalized anxiety disorder (GAD) is a mental health condition that involves worry that is not triggered by a specific event.  People with GAD often worry excessively about many things in their lives, such as their health and family.  GAD may cause symptoms such as restlessness, trouble concentrating, sleep problems, frequent sweating, nausea, diarrhea, headaches, and trembling or muscle twitching.  A mental health specialist can help determine which treatment is best for you. Some people see improvement with one type of therapy. However, other people require a combination of therapies. This information is not intended to replace advice given to you by your health care provider. Make sure you discuss any questions you have with your health care provider. Document Revised: 07/17/2019 Document Reviewed: 07/17/2019 Elsevier Patient Education  [place].

## 2021-02-23 NOTE — Assessment & Plan Note (Signed)
Assessment: BP 149/93 today- pt is anxious Hx HTN in mother No concerning findings or alarm symptoms present today Discussion/Recommendations: Continue daily exercise- increase to 5 days a week for at least 20 minutes at a time Monitor BP at home and report BP readings Plan: F/U 2-4 weeks to discuss at home BP Labs today

## 2021-02-23 NOTE — Assessment & Plan Note (Addendum)
No known health history.  Has not been seen, other than gallbladder removal, since prior to entering college. Will obtain labs today for evaluation.  BP elevated in office today- pt is anxious about appt. Will have pt monitor at home and F/U Tdap due- given today Pap w/HPV due- scheduled for near future with GYN- discussed what to expect from experience Unclear if HPV vaccines received.  Thyroid feels enlarged- will obtain labs Indications of PCOS or thyroid dysfunction present.  F/U in 1 year for CPE or sooner if needed

## 2021-02-23 NOTE — Assessment & Plan Note (Addendum)
New patient to practice to establish care.  No previous PCP within last 3 years- no records to be requested. Review of current and past medical history, surgical history, medications, family history, and SDOH completed today. Medication review completed with recommendations.   Health maintenance reviewed and will be updated once previous records received.  Annual Exam today-labs ordered  Pap scheduled in next few weeks with GYN Follow-up sooner if needed.

## 2021-02-23 NOTE — Addendum Note (Signed)
Addended by: Agapito Games D on: 02/23/2021 02:04 PM   Modules accepted: Orders

## 2021-02-23 NOTE — Progress Notes (Signed)
Shawna Clamp, DNP, AGNP-c Primary Care Services ______________________________________________________________________________________________________________________________________________  HPI Briana Barber is a 30 y.o. year old female presenting to [facility] at [facility] today to establish care.  No previous PCP. Gallbladder removal in 2018.  Concerns today: Anxiety Over thinking, "in my head a lot" No problems sleeping, palpitations, chest pain, or shortness of breath associated with symptoms.  Never formally diagnosed No medications  Irregular Menses Sporadic mensuration, about 7 per year in the last few years. Regular when started menstruating- started in 6th grade (about 11-12 years), more irregular as she has gotten older.  Heavy Flow, Last 6-7 days Weight gain and hair growth  PHQ9 Today: Depression screen [facility] 2/9 02/23/2021  Decreased Interest 0  Down, Depressed, Hopeless 1  PHQ - 2 Score 1  Altered sleeping 0  Tired, decreased energy 2  Change in appetite 1  Feeling bad or failure about yourself  1  Trouble concentrating 0  Moving slowly or fidgety/restless 1  Suicidal thoughts 0  PHQ-9 Score 6  Difficult doing work/chores Somewhat difficult   GAD7 Today: GAD 7 : Generalized Anxiety Score 02/23/2021  Nervous, Anxious, on Edge 2  Control/stop worrying 1  Worry too much - different things 2  Trouble relaxing 1  Restless 0  Easily annoyed or irritable 1  Afraid - awful might happen 2  Total GAD 7 Score 9  Anxiety Difficulty Somewhat difficult    PMH Past Medical History:  Diagnosis Date  . Anxiety    no meds  . Cholelithiasis   . Irritable bowel syndrome (IBS) 2018  . Right upper quadrant abdominal pain 01/18/2017    ROS All review of systems negative except what is listed in the HPI  PHYSICAL EXAM General Appearance:  awake, alert, oriented, in no acute distress, well developed, well nourished and  obese Skin:  skin color, texture, turgor are normal; there are no bruises, rashes or lesions., hirsutism present on chest Head/face:  NCAT Eyes:  No gross abnormalities., PERRL, EOMI and Sclera nonicteric Ears:  canals and TMs NI Nose/Sinuses:  Nares normal. Septum midline. Mucosa normal. No drainage or sinus tenderness. Mouth/Throat:  Mucosa moist, no lesions; pharynx without erythema, edema or exudate. Neck:  neck- supple, no mass, non-tender, no bruits, no jvd and Thyroid- enlarged,, non-tender, no masses Back:  no pain to palpation, good flexion and extension, motor and sensory appear to be normal Lungs:  Normal expansion.  Clear to auscultation.  No rales, rhonchi, or wheezing., No chest wall tenderness., No kyphosis or scoliosis. Heart:  Heart sounds are normal.  Regular rate and rhythm without murmur, gallop or rub. Abdomen:  Soft, non-tender, normal bowel sounds; no bruits, organomegaly or masses. Musculoskeletal:  Spine range of motion normal. Muscular strength intact., Range of motion normal in hips, knees, shoulders, and spine., No joint swelling, deformity, or tenderness. Peripheral Pulses:  Capillary refill <2secs, strong peripheral pulses Neurologic:  Alert and oriented x 3, gait normal., reflexes normal and symmetric, strength and  sensation grossly normal Psych exam:alert,oriented, in NAD with a full range of affect, normal behavior and no psychotic features, moderate anxiety about exam visible.  ASSESSMENT AND PLAN Problem List Items Addressed This Visit    Encounter to establish care - Primary    New patient to practice to establish care.  No previous PCP within last 3 years- no records to be requested. Review of current and past medical history, surgical history, medications, family history, and SDOH completed today. Medication  review completed with recommendations.   Health maintenance reviewed and will be updated once previous records received.  Annual Exam today-labs  ordered  Pap scheduled in next few weeks with GYN Follow-up sooner if needed.        Physical exam    No known health history.  Has not been seen, other than gallbladder removal, since prior to entering college. Will obtain labs today for evaluation.  BP elevated in office today- pt is anxious about appt. Will have pt monitor at home and F/U Tdap due- given today Pap w/HPV due- scheduled for near future with GYN- discussed what to expect from experience Unclear if HPV vaccines received.  Thyroid feels enlarged- will obtain labs Indications of PCOS or thyroid dysfunction present.  F/U in 1 year for CPE or sooner if needed      BMI 45.0-49.9, adult (HCC)    Assessment: BMI 46.62 today Concerns for PCOS present Thyroid enlarged Discussion/Recommendations: Diet and exercise recommendations provided Continue daily walks- recommend at least 20 min 5 days a week Plan: Will review labs  Recommend discuss with GYN about possible PCOS and recommendations       Relevant Orders   Hemoglobin A1c   Lipid panel   Thyroid Panel With TSH   Elevated BP without diagnosis of hypertension    Assessment: BP 149/93 today- pt is anxious Hx HTN in mother No concerning findings or alarm symptoms present today Discussion/Recommendations: Continue daily exercise- increase to 5 days a week for at least 20 minutes at a time Monitor BP at home and report BP readings Plan: F/U 2-4 weeks to discuss at home BP Labs today       Need for diphtheria-tetanus-pertussis (Tdap) vaccine   Screening for diabetes mellitus   Relevant Orders   Hemoglobin A1c   Screening for endocrine, nutritional, metabolic and immunity disorder   Relevant Orders   CBC w/Diff/Platelet   Comprehensive metabolic panel   Hemoglobin A1c   Lipid panel   Thyroid Panel With TSH   HIV antibody (with reflex)   Hepatitis C Antibody   Screening for cholesterol level   Relevant Orders   Lipid panel   Screening for  deficiency anemia   Relevant Orders   CBC w/Diff/Platelet   Abnormal menses    Assessment: Historically normal menses at age 40-12 with gradual decrease in monthly periods to about 7 per month Hirsutism present on examination today with BMI 45.62- thyroid does feel enlarged. No OCP or other hormonal medications Consider PCOS or thyroid dysfunction Discussion/Recommendations: PCOS information explained and handout provided Suggest discussion with GYN at upcoming appt  Will obtain thyroid labs today Plan: Will consider further testing and evaluation if not performed by GYN OCP may be helpful for regulation of menses. Will make changes to plan of care based on labs.         Other Visit Diagnoses    Encounter for hepatitis C screening test for low risk patient       Relevant Orders   Hepatitis C Antibody   Encounter for screening for human immunodeficiency virus (HIV)       Relevant Orders   HIV antibody (with reflex)      Education provided today during visit and on AVS for patient to review at home. Diet and Exercise recommendations as well as routine health maintenance information provided.  Current diagnoses discussed and recommendations provided.   Outpatient Encounter Medications as of 02/23/2021  Medication Sig  . [DISCONTINUED] acetaminophen (TYLENOL) 500 MG  tablet Take 1,000 mg by mouth every 6 (six) hours as needed for mild pain or moderate pain.  . [DISCONTINUED] ibuprofen (ADVIL,MOTRIN) 200 MG tablet Take 200 mg by mouth every 6 (six) hours as needed for mild pain or moderate pain.  . [DISCONTINUED] oxyCODONE-acetaminophen (ROXICET) 5-325 MG tablet Take 1-2 tablets by mouth every 4 (four) hours as needed for severe pain.   No facility-administered encounter medications on file as of 02/23/2021.   Time: 60 minutes, >50% spent counseling, care coordination, chart review, and documentation.    Return in about 2 weeks (around 03/09/2021) for CPE today- needs lab appt -  F/U in 2-4 weeks for BP check (virtual visit ok).  Tollie Eth, DNP, AGNP-c

## 2021-03-03 LAB — TESTOSTERONE: Testosterone: 52

## 2021-03-03 LAB — RESULTS CONSOLE HPV: CHL HPV: NEGATIVE

## 2021-03-03 LAB — TSH: TSH: 2.96 (ref 0.41–5.90)

## 2021-03-03 LAB — HM PAP SMEAR

## 2021-03-09 ENCOUNTER — Other Ambulatory Visit (HOSPITAL_BASED_OUTPATIENT_CLINIC_OR_DEPARTMENT_OTHER)
Admission: RE | Admit: 2021-03-09 | Discharge: 2021-03-09 | Disposition: A | Discharge status: Home / Self Care | Payer: 59 | Source: Ambulatory Visit | Attending: Nurse Practitioner | Admitting: Nurse Practitioner

## 2021-03-09 ENCOUNTER — Encounter (HOSPITAL_BASED_OUTPATIENT_CLINIC_OR_DEPARTMENT_OTHER): Payer: Self-pay | Admitting: Nurse Practitioner

## 2021-03-09 ENCOUNTER — Other Ambulatory Visit: Payer: Self-pay

## 2021-03-09 ENCOUNTER — Ambulatory Visit (HOSPITAL_BASED_OUTPATIENT_CLINIC_OR_DEPARTMENT_OTHER): Payer: 59 | Admitting: Nurse Practitioner

## 2021-03-09 VITALS — BP 150/80 | HR 94 | Ht 64.0 in | Wt 272.0 lb

## 2021-03-09 DIAGNOSIS — Z1159 Encounter for screening for other viral diseases: Secondary | ICD-10-CM | POA: Diagnosis present

## 2021-03-09 DIAGNOSIS — Z13220 Encounter for screening for lipoid disorders: Secondary | ICD-10-CM | POA: Diagnosis present

## 2021-03-09 DIAGNOSIS — Z1321 Encounter for screening for nutritional disorder: Secondary | ICD-10-CM | POA: Insufficient documentation

## 2021-03-09 DIAGNOSIS — Z1329 Encounter for screening for other suspected endocrine disorder: Secondary | ICD-10-CM | POA: Diagnosis present

## 2021-03-09 DIAGNOSIS — Z114 Encounter for screening for human immunodeficiency virus [HIV]: Secondary | ICD-10-CM | POA: Insufficient documentation

## 2021-03-09 DIAGNOSIS — Z131 Encounter for screening for diabetes mellitus: Secondary | ICD-10-CM | POA: Diagnosis present

## 2021-03-09 DIAGNOSIS — I10 Essential (primary) hypertension: Secondary | ICD-10-CM | POA: Insufficient documentation

## 2021-03-09 DIAGNOSIS — Z13228 Encounter for screening for other metabolic disorders: Secondary | ICD-10-CM | POA: Diagnosis present

## 2021-03-09 DIAGNOSIS — Z6841 Body Mass Index (BMI) 40.0 and over, adult: Secondary | ICD-10-CM | POA: Insufficient documentation

## 2021-03-09 DIAGNOSIS — Z130 Encounter for screening for diseases of the blood and blood-forming organs and certain disorders involving the immune mechanism: Secondary | ICD-10-CM | POA: Insufficient documentation

## 2021-03-09 LAB — CBC WITH DIFFERENTIAL/PLATELET
Abs Immature Granulocytes: 0.02 10*3/uL (ref 0.00–0.07)
Basophils Absolute: 0 10*3/uL (ref 0.0–0.1)
Basophils Relative: 1 %
Eosinophils Absolute: 0.1 10*3/uL (ref 0.0–0.5)
Eosinophils Relative: 2 %
HCT: 38.7 % (ref 36.0–46.0)
Hemoglobin: 12.5 g/dL (ref 12.0–15.0)
Immature Granulocytes: 0 %
Lymphocytes Relative: 31 %
Lymphs Abs: 2.4 10*3/uL (ref 0.7–4.0)
MCH: 27.8 pg (ref 26.0–34.0)
MCHC: 32.3 g/dL (ref 30.0–36.0)
MCV: 86.2 fL (ref 80.0–100.0)
Monocytes Absolute: 0.4 10*3/uL (ref 0.1–1.0)
Monocytes Relative: 5 %
Neutro Abs: 4.8 10*3/uL (ref 1.7–7.7)
Neutrophils Relative %: 61 %
Platelets: 418 10*3/uL — ABNORMAL HIGH (ref 150–400)
RBC: 4.49 MIL/uL (ref 3.87–5.11)
RDW: 14 % (ref 11.5–15.5)
WBC: 7.8 10*3/uL (ref 4.0–10.5)
nRBC: 0 % (ref 0.0–0.2)

## 2021-03-09 LAB — COMPREHENSIVE METABOLIC PANEL
ALT: 31 U/L (ref 0–44)
AST: 20 U/L (ref 15–41)
Albumin: 4.1 g/dL (ref 3.5–5.0)
Alkaline Phosphatase: 53 U/L (ref 38–126)
Anion gap: 9 (ref 5–15)
BUN: 13 mg/dL (ref 6–20)
CO2: 27 mmol/L (ref 22–32)
Calcium: 8.8 mg/dL — ABNORMAL LOW (ref 8.9–10.3)
Chloride: 102 mmol/L (ref 98–111)
Creatinine, Ser: 0.83 mg/dL (ref 0.44–1.00)
GFR, Estimated: >60 mL/min (ref >60)
Glucose, Bld: 87 mg/dL (ref 70–99)
Potassium: 3.8 mmol/L (ref 3.5–5.1)
Sodium: 138 mmol/L (ref 135–145)
Total Bilirubin: 0.4 mg/dL (ref 0.3–1.2)
Total Protein: 7.1 g/dL (ref 6.5–8.1)

## 2021-03-09 LAB — LIPID PANEL
Cholesterol: 183 mg/dL (ref 0–200)
HDL: 42 mg/dL (ref >40)
LDL Cholesterol: 124 mg/dL — ABNORMAL HIGH (ref 0–99)
Total CHOL/HDL Ratio: 4.4 RATIO
Triglycerides: 83 mg/dL (ref <150)
VLDL: 17 mg/dL (ref 0–40)

## 2021-03-09 LAB — HIV ANTIBODY (ROUTINE TESTING W REFLEX): HIV Screen 4th Generation wRfx: NONREACTIVE

## 2021-03-09 LAB — HEPATITIS C ANTIBODY: HCV Ab: NONREACTIVE

## 2021-03-09 MED ORDER — HYDROCHLOROTHIAZIDE 25 MG PO TABS: 25.0000 mg | ORAL_TABLET | Freq: Every day | ORAL | 4 refills | Status: DC

## 2021-03-09 NOTE — Patient Instructions (Addendum)
Managing Your Hypertension Hypertension, also called high blood pressure, is when the force of the blood pressing against the walls of the arteries is too strong. Arteries are blood vessels that carry blood from your heart throughout your body. Hypertension forces the heart to work harder to pump blood and may cause the arteries to become narrow or stiff. Understanding blood pressure readings Your personal target blood pressure may vary depending on your medical conditions, your age, and other factors. A blood pressure reading includes a higher number over a lower number. Ideally, your blood pressure should be below 120/80. You should know that:  The first, or top, number is called the systolic pressure. It is a measure of the pressure in your arteries as your heart beats.  The second, or bottom number, is called the diastolic pressure. It is a measure of the pressure in your arteries as the heart relaxes. Blood pressure is classified into four stages. Based on your blood pressure reading, your health care provider may use the following stages to determine what type of treatment you need, if any. Systolic pressure and diastolic pressure are measured in a unit called mmHg. Normal  Systolic pressure: below 120.  Diastolic pressure: below 80. Elevated  Systolic pressure: 120-129.  Diastolic pressure: below 80. Hypertension stage 1  Systolic pressure: 130-139.  Diastolic pressure: 80-89. Hypertension stage 2  Systolic pressure: 140 or above.  Diastolic pressure: 90 or above. How can this condition affect me? Managing your hypertension is an important responsibility. Over time, hypertension can damage the arteries and decrease blood flow to important parts of the body, including the brain, heart, and kidneys. Having untreated or uncontrolled hypertension can lead to:  A heart attack.  A stroke.  A weakened blood vessel (aneurysm).  Heart failure.  Kidney damage.  Eye  damage.  Metabolic syndrome.  Memory and concentration problems.  Vascular dementia. What actions can I take to manage this condition? Hypertension can be managed by making lifestyle changes and possibly by taking medicines. Your health care provider will help you make a plan to bring your blood pressure within a normal range. Nutrition  Eat a diet that is high in fiber and potassium, and low in salt (sodium), added sugar, and fat. An example eating plan is called the Dietary Approaches to Stop Hypertension (DASH) diet. To eat this way: ? Eat plenty of fresh fruits and vegetables. Try to fill one-half of your plate at each meal with fruits and vegetables. ? Eat whole grains, such as whole-wheat pasta, brown rice, or whole-grain bread. Fill about one-fourth of your plate with whole grains. ? Eat low-fat dairy products. ? Avoid fatty cuts of meat, processed or cured meats, and poultry with skin. Fill about one-fourth of your plate with lean proteins such as fish, chicken without skin, beans, eggs, and tofu. ? Avoid pre-made and processed foods. These tend to be higher in sodium, added sugar, and fat.  Reduce your daily sodium intake. Most people with hypertension should eat less than 1,500 mg of sodium a day.   Lifestyle  Work with your health care provider to maintain a healthy body weight or to lose weight. Ask what an ideal weight is for you.  Get at least 30 minutes of exercise that causes your heart to beat faster (aerobic exercise) most days of the week. Activities may include walking, swimming, or biking.  Include exercise to strengthen your muscles (resistance exercise), such as weight lifting, as part of your weekly exercise routine. Try   to do these types of exercises for 30 minutes at least 3 days a week.  Do not use any products that contain nicotine or tobacco, such as cigarettes, e-cigarettes, and chewing tobacco. If you need help quitting, ask your health care  provider.  Control any long-term (chronic) conditions you have, such as high cholesterol or diabetes.  Identify your sources of stress and find ways to manage stress. This may include meditation, deep breathing, or making time for fun activities.   Alcohol use  Do not drink alcohol if: ? Your health care provider tells you not to drink. ? You are pregnant, may be pregnant, or are planning to become pregnant.  If you drink alcohol: ? Limit how much you use to:  0-1 drink a day for women.  0-2 drinks a day for men. ? Be aware of how much alcohol is in your drink. In the [place] one drink equals one 12 oz bottle of beer (355 mL), one 5 oz glass of wine (148 mL), or one 1 oz glass of hard liquor (44 mL). Medicines Your health care provider may prescribe medicine if lifestyle changes are not enough to get your blood pressure under control and if:  Your systolic blood pressure is 130 or higher.  Your diastolic blood pressure is 80 or higher. Take medicines only as told by your health care provider. Follow the directions carefully. Blood pressure medicines must be taken as told by your health care provider. The medicine does not work as well when you skip doses. Skipping doses also puts you at risk for problems. Monitoring Before you monitor your blood pressure:  Do not smoke, drink caffeinated beverages, or exercise within 30 minutes before taking a measurement.  Use the bathroom and empty your bladder (urinate).  Sit quietly for at least 5 minutes before taking measurements. Monitor your blood pressure at home as told by your health care provider. To do this:  Sit with your back straight and supported.  Place your feet flat on the floor. Do not cross your legs.  Support your arm on a flat surface, such as a table. Make sure your upper arm is at heart level.  Each time you measure, take two or three readings one minute apart and record the results. You may also need to have your  blood pressure checked regularly by your health care provider.   General information  Talk with your health care provider about your diet, exercise habits, and other lifestyle factors that may be contributing to hypertension.  Review all the medicines you take with your health care provider because there may be side effects or interactions.  Keep all visits as told by your health care provider. Your health care provider can help you create and adjust your plan for managing your high blood pressure. Where to find more information  National Heart, Lung, and Blood Institute: www.nhlbi.nih.gov  American Heart Association: www.heart.org Contact a health care provider if:  You think you are having a reaction to medicines you have taken.  You have repeated (recurrent) headaches.  You feel dizzy.  You have swelling in your ankles.  You have trouble with your vision. Get help right away if:  You develop a severe headache or confusion.  You have unusual weakness or numbness, or you feel faint.  You have severe pain in your chest or abdomen.  You vomit repeatedly.  You have trouble breathing. These symptoms may represent a serious problem that is an emergency. Do not wait   to see if the symptoms will go away. Get medical help right away. Call your local emergency services (911 in the [place] Do not drive yourself to the hospital. Summary  Hypertension is when the force of blood pumping through your arteries is too strong. If this condition is not controlled, it may put you at risk for serious complications.  Your personal target blood pressure may vary depending on your medical conditions, your age, and other factors. For most people, a normal blood pressure is less than 120/80.  Hypertension is managed by lifestyle changes, medicines, or both.  Lifestyle changes to help manage hypertension include losing weight, eating a healthy, low-sodium diet, exercising more, stopping smoking, and  limiting alcohol. This information is not intended to replace advice given to you by your health care provider. Make sure you discuss any questions you have with your health care provider. Document Revised: 11/01/2019 Document Reviewed: 08/27/2019 Elsevier Patient Education  2021 Elsevier Inc.    https://www.mata.com/https://www.nhlbi.nih.gov/files/docs/public/heart/dash_brief.pdf">  DASH Eating Plan DASH stands for Dietary Approaches to Stop Hypertension. The DASH eating plan is a healthy eating plan that has been shown to:  Reduce high blood pressure (hypertension).  Reduce your risk for type 2 diabetes, heart disease, and stroke.  Help with weight loss. What are tips for following this plan? Reading food labels  Check food labels for the amount of salt (sodium) per serving. Choose foods with less than 5 percent of the Daily Value of sodium. Generally, foods with less than 300 milligrams (mg) of sodium per serving fit into this eating plan.  To find whole grains, look for the word "whole" as the first word in the ingredient list. Shopping  Buy products labeled as "low-sodium" or "no salt added."  Buy fresh foods. Avoid canned foods and pre-made or frozen meals. Cooking  Avoid adding salt when cooking. Use salt-free seasonings or herbs instead of table salt or sea salt. Check with your health care provider or pharmacist before using salt substitutes.  Do not fry foods. Cook foods using healthy methods such as baking, boiling, grilling, roasting, and broiling instead.  Cook with heart-healthy oils, such as olive, canola, avocado, soybean, or sunflower oil. Meal planning  Eat a balanced diet that includes: ? 4 or more servings of fruits and 4 or more servings of vegetables each day. Try to fill one-half of your plate with fruits and vegetables. ? 6-8 servings of whole grains each day. ? Less than 6 oz (170 g) of lean meat, poultry, or fish each day. A 3-oz (85-g) serving of meat is about the same  size as a deck of cards. One egg equals 1 oz (28 g). ? 2-3 servings of low-fat dairy each day. One serving is 1 cup (237 mL). ? 1 serving of nuts, seeds, or beans 5 times each week. ? 2-3 servings of heart-healthy fats. Healthy fats called omega-3 fatty acids are found in foods such as walnuts, flaxseeds, fortified milks, and eggs. These fats are also found in cold-water fish, such as sardines, salmon, and mackerel.  Limit how much you eat of: ? Canned or prepackaged foods. ? Food that is high in trans fat, such as some fried foods. ? Food that is high in saturated fat, such as fatty meat. ? Desserts and other sweets, sugary drinks, and other foods with added sugar. ? Full-fat dairy products.  Do not salt foods before eating.  Do not eat more than 4 egg yolks a week.  Try to eat at least  2 vegetarian meals a week.  Eat more home-cooked food and less restaurant, buffet, and fast food.   Lifestyle  When eating at a restaurant, ask that your food be prepared with less salt or no salt, if possible.  If you drink alcohol: ? Limit how much you use to:  0-1 drink a day for women who are not pregnant.  0-2 drinks a day for men. ? Be aware of how much alcohol is in your drink. In the [place] one drink equals one 12 oz bottle of beer (355 mL), one 5 oz glass of wine (148 mL), or one 1 oz glass of hard liquor (44 mL). General information  Avoid eating more than 2,300 mg of salt a day. If you have hypertension, you may need to reduce your sodium intake to 1,500 mg a day.  Work with your health care provider to maintain a healthy body weight or to lose weight. Ask what an ideal weight is for you.  Get at least 30 minutes of exercise that causes your heart to beat faster (aerobic exercise) most days of the week. Activities may include walking, swimming, or biking.  Work with your health care provider or dietitian to adjust your eating plan to your individual calorie needs. What foods should I  eat? Fruits All fresh, dried, or frozen fruit. Canned fruit in natural juice (without added sugar). Vegetables Fresh or frozen vegetables (raw, steamed, roasted, or grilled). Low-sodium or reduced-sodium tomato and vegetable juice. Low-sodium or reduced-sodium tomato sauce and tomato paste. Low-sodium or reduced-sodium canned vegetables. Grains Whole-grain or whole-wheat bread. Whole-grain or whole-wheat pasta. Brown rice. Orpah Cobb. Bulgur. Whole-grain and low-sodium cereals. Pita bread. Low-fat, low-sodium crackers. Whole-wheat flour tortillas. Meats and other proteins Skinless chicken or [place]. Ground chicken or [place]. Pork with fat trimmed off. Fish and seafood. Egg whites. Dried beans, peas, or lentils. Unsalted nuts, nut butters, and seeds. Unsalted canned beans. Lean cuts of beef with fat trimmed off. Low-sodium, lean precooked or cured meat, such as sausages or meat loaves. Dairy Low-fat (1%) or fat-free (skim) milk. Reduced-fat, low-fat, or fat-free cheeses. Nonfat, low-sodium ricotta or cottage cheese. Low-fat or nonfat yogurt. Low-fat, low-sodium cheese. Fats and oils Soft margarine without trans fats. Vegetable oil. Reduced-fat, low-fat, or light mayonnaise and salad dressings (reduced-sodium). Canola, safflower, olive, avocado, soybean, and sunflower oils. Avocado. Seasonings and condiments Herbs. Spices. Seasoning mixes without salt. Other foods Unsalted popcorn and pretzels. Fat-free sweets. The items listed above may not be a complete list of foods and beverages you can eat. Contact a dietitian for more information. What foods should I avoid? Fruits Canned fruit in a light or heavy syrup. Fried fruit. Fruit in cream or butter sauce. Vegetables Creamed or fried vegetables. Vegetables in a cheese sauce. Regular canned vegetables (not low-sodium or reduced-sodium). Regular canned tomato sauce and paste (not low-sodium or reduced-sodium). Regular tomato and vegetable juice  (not low-sodium or reduced-sodium). Rosita Fire. Olives. Grains Baked goods made with fat, such as croissants, muffins, or some breads. Dry pasta or rice meal packs. Meats and other proteins Fatty cuts of meat. Ribs. Fried meat. Tomasa Blase. Bologna, salami, and other precooked or cured meats, such as sausages or meat loaves. Fat from the back of a pig (fatback). Bratwurst. Salted nuts and seeds. Canned beans with added salt. Canned or smoked fish. Whole eggs or egg yolks. Chicken or [place] with skin. Dairy Whole or 2% milk, cream, and half-and-half. Whole or full-fat cream cheese. Whole-fat or sweetened yogurt. Full-fat cheese. Nondairy  creamers. Whipped toppings. Processed cheese and cheese spreads. Fats and oils Butter. Stick margarine. Lard. Shortening. Ghee. Bacon fat. Tropical oils, such as coconut, palm kernel, or palm oil. Seasonings and condiments Onion salt, garlic salt, seasoned salt, table salt, and sea salt. Worcestershire sauce. Tartar sauce. Barbecue sauce. Teriyaki sauce. Soy sauce, including reduced-sodium. Steak sauce. Canned and packaged gravies. Fish sauce. Oyster sauce. Cocktail sauce. Store-bought horseradish. Ketchup. Mustard. Meat flavorings and tenderizers. Bouillon cubes. Hot sauces. Pre-made or packaged marinades. Pre-made or packaged taco seasonings. Relishes. Regular salad dressings. Other foods Salted popcorn and pretzels. The items listed above may not be a complete list of foods and beverages you should avoid. Contact a dietitian for more information. Where to find more information  National Heart, Lung, and Blood Institute: PopSteam.is  American Heart Association: www.heart.org  Academy of Nutrition and Dietetics: www.eatright.org  National Kidney Foundation: www.kidney.org Summary  The DASH eating plan is a healthy eating plan that has been shown to reduce high blood pressure (hypertension). It may also reduce your risk for type 2 diabetes, heart disease, and  stroke.  When on the DASH eating plan, aim to eat more fresh fruits and vegetables, whole grains, lean proteins, low-fat dairy, and heart-healthy fats.  With the DASH eating plan, you should limit salt (sodium) intake to 2,300 mg a day. If you have hypertension, you may need to reduce your sodium intake to 1,500 mg a day.  Work with your health care provider or dietitian to adjust your eating plan to your individual calorie needs. This information is not intended to replace advice given to you by your health care provider. Make sure you discuss any questions you have with your health care provider. Document Revised: 08/30/2019 Document Reviewed: 08/30/2019 Elsevier Patient Education  2021 Elsevier Inc.  Hydrochlorothiazide Capsules or Tablets What is this medicine? HYDROCHLOROTHIAZIDE (hye droe klor oh THYE a zide) is a diuretic. It helps you make more urine and to lose salt and excess water from your body. It treats swelling from heart, kidney, or liver disease. It also treats high blood pressure. This medicine may be used for other purposes; ask your health care provider or pharmacist if you have questions. COMMON BRAND NAME(S): Esidrix, Ezide, HydroDIURIL, Microzide, Oretic, Zide What should I tell my health care provider before I take this medicine? They need to know if you have any of these conditions:  diabetes  gout  kidney disease  liver disease  lupus  pancreatitis  an unusual or allergic reaction to hydrochlorothiazide, sulfa drugs, other medicines, foods, dyes, or preservatives  pregnant or trying to get pregnant  breast-feeding How should I use this medicine? Take this medicine by mouth. Take it as directed on the prescription label at the same time every day. You can take it with or without food. If it upsets your stomach, take it with food. Keep taking it unless your health care provider tells you to stop. Talk to your health care provider about the use of this  medicine in children. While it may be prescribed for children as young as newborns for selected conditions, precautions do apply. Overdosage: If you think you have taken too much of this medicine contact a poison control center or emergency room at once. NOTE: This medicine is only for you. Do not share this medicine with others. What if I miss a dose? If you miss a dose, take it as soon as you can. If it is almost time for your next dose, take only that  dose. Do not take double or extra doses. What may interact with this medicine?  cholestyramine  colestipol  digoxin  dofetilide  lithium  medicines for blood pressure  medicines for diabetes  medicines that relax muscles for surgery  other diuretics  steroid medicines like prednisone or cortisone This list may not describe all possible interactions. Give your health care provider a list of all the medicines, herbs, non-prescription drugs, or dietary supplements you use. Also tell them if you smoke, drink alcohol, or use illegal drugs. Some items may interact with your medicine. What should I watch for while using this medicine? Visit your health care provider for regular check ups. Check your blood pressure as directed. Ask your health care provider what your blood pressure should be. Also, find out when you should contact him or her. Do not treat yourself for coughs, colds, or pain while you are using this medicine without asking your health care provider for advice. Some medicines may increase your blood pressure. You may get drowsy or dizzy. Do not drive, use machinery, or do anything that needs mental alertness until you know how this medicine affects you. Do not stand or sit up quickly, especially if you are an older patient. This reduces the risk of dizzy or fainting spells. Alcohol can make you more drowsy and dizzy. Avoid alcoholic drinks. Talk to your health care professional about your risk of skin cancer. You may be more at  risk for skin cancer if you take this medicine. This medicine can make you more sensitive to the sun. Keep out of the sun. If you cannot avoid being in the sun, wear protective clothing and use sunscreen. Do not use sun lamps or tanning beds/booths. You may need to be on a special diet while taking this medicine. Ask your health care provider. Also, find out how many glasses of fluids you need to drink each day. Check with your health care provider if you get an attack of severe diarrhea, nausea and vomiting, or if you sweat a lot. The loss of too much body fluid can make it dangerous for you to take this medicine. This medicine may increase blood sugar. Ask your healthcare provider if changes in diet or medicines are needed if you have diabetes. What side effects may I notice from receiving this medicine? Side effects that you should report to your doctor or health care professional as soon as possible:  allergic reactions (skin rash, itching or hives; swelling of the face, lips, or tongue)  gout (severe pain, redness, or swelling in joints like the big toe)  high blood sugar (increased hunger, thirst or urination; unusually weak or tired; blurry vision)  kidney injury (trouble passing urine or change in the amount of urine)  low blood pressure (dizziness; feeling faint or lightheaded, falls; unusually weak or tired)  low potassium levels (trouble breathing; chest pain; dizziness; fast, irregular heartbeat; feeling faint or lightheaded, falls; muscle cramps or pain)  sudden change in vision or eye pain Side effects that usually do not require medical attention (report to your doctor or health care professional if they continue or are bothersome):  change in sex drive or performance  dry mouth  headache  stomach upset This list may not describe all possible side effects. Call your doctor for medical advice about side effects. You may report side effects to FDA at 1-800-FDA-1088. Where  should I keep my medicine? Keep out of the reach of children and pets. Store at room temperature  between 20 and 25 degrees C (68 and 77 degrees F). Protect from light and moisture. Keep the container tightly closed. Do not freeze. Get rid of any unused medicine after the expiration date. To get rid of medicines that are no longer needed or have expired:  Take the medicine to a medicine take-back program. Check with your pharmacy or law enforcement to find a location.  If you cannot return the medicine, check the label or package insert to see if the medicine should be thrown out in the garbage or flushed down the toilet. If you are not sure, ask your health care provider. If it is safe to put in the trash, empty the medicine out of the container. Mix the medicine with cat litter, dirt, coffee grounds, or other unwanted substance. Seal the mixture in a bag or container. Put it in the trash. NOTE: This sheet is a summary. It may not cover all possible information. If you have questions about this medicine, talk to your doctor, pharmacist, or health care provider.  2021 Elsevier/Gold Standard (2020-08-05 17:16:00)

## 2021-03-09 NOTE — Progress Notes (Signed)
Established Patient Office Visit  Subjective:  Patient ID: Briana Barber, female    DOB: 20-May-1991  Age: 30 y.o. MRN: [id number]  CC: No chief complaint on file.   HPI [name] presents for f/u for elevated blood pressures noted at recent visit to establish care. Has been checking BP at home with systolics in 140's-150's/80's-90's No headaches, vision changes, dizziness, palpitations, chest pain Testing for PCOS by OB Had fasting labs drawn prior to visit today   Past Medical History:  Diagnosis Date  . Anxiety    no meds  . Cholelithiasis   . Irritable bowel syndrome (IBS) 2018  . Right upper quadrant abdominal pain 01/18/2017    Past Surgical History:  Procedure Laterality Date  . CHOLECYSTECTOMY N/A 06/21/2017   Procedure: LAPAROSCOPIC CHOLECYSTECTOMY;  Surgeon: Ancil Linsey, MD;  Location: [facility] ORS;  Service: General;  Laterality: N/A;  . NO PAST SURGERIES      Family History  Problem Relation Age of Onset  . Hypertension Mother   . Cancer Maternal Grandmother   . Hypertension Maternal Grandmother   . Colon cancer Maternal Grandmother   . Heart disease Maternal Grandfather   . Hypertension Maternal Grandfather     Social History   Socioeconomic History  . Marital status: Significant Other    Spouse name: Not on file  . Number of children: Not on file  . Years of education: Not on file  . Highest education level: Not on file  Occupational History  . Occupation: Insurance claims handler    Comment: [employer]  Tobacco Use  . Smoking status: Never Smoker  . Smokeless tobacco: Never Used  Vaping Use  . Vaping Use: Never used  Substance and Sexual Activity  . Alcohol use: Never    Comment: rare  . Drug use: Never  . Sexual activity: Yes    Partners: Male    Birth control/protection: Condom  Other Topics Concern  . Not on file  Social History Narrative  . Not on file   Social Determinants of Health   Financial Resource  Strain: Not on file  Food Insecurity: Not on file  Transportation Needs: No Transportation Needs  . Lack of Transportation (Medical): No  . Lack of Transportation (Non-Medical): No  Physical Activity: Insufficiently Active  . Days of Exercise per Week: 3 days  . Minutes of Exercise per Session: 30 min  Stress: No Stress Concern Present  . Feeling of Stress : Not at all  Social Connections: Moderately Integrated  . Frequency of Communication with Friends and Family: More than three times a week  . Frequency of Social Gatherings with Friends and Family: More than three times a week  . Attends Religious Services: More than 4 times per year  . Active Member of Clubs or Organizations: Yes  . Attends Banker Meetings: More than 4 times per year  . Marital Status: Never married  Intimate Partner Violence: Not At Risk  . Fear of Current or Ex-Partner: No  . Emotionally Abused: No  . Physically Abused: No  . Sexually Abused: No    No outpatient medications prior to visit.   No facility-administered medications prior to visit.    No Known Allergies  ROS Review of Systems All review of systems negative except what is listed in the HPI    Objective:    Physical Exam Vitals and nursing note reviewed.  Constitutional:      Appearance: Normal appearance. She is obese.  HENT:     Head: Normocephalic and atraumatic.  Eyes:     Extraocular Movements: Extraocular movements intact.     Conjunctiva/sclera: Conjunctivae normal.     Pupils: Pupils are equal, round, and reactive to light.  Neck:     Vascular: No carotid bruit.  Cardiovascular:     Rate and Rhythm: Normal rate and regular rhythm.     Chest Wall: PMI is not displaced. No thrill.     Pulses: Normal pulses.     Heart sounds: Normal heart sounds.  Pulmonary:     Effort: Pulmonary effort is normal.     Breath sounds: Normal breath sounds.  Abdominal:     General: Abdomen is flat. Bowel sounds are normal.  There is no distension.     Palpations: Abdomen is soft.     Tenderness: There is no abdominal tenderness. There is no right CVA tenderness, left CVA tenderness or guarding.  Musculoskeletal:        General: Normal range of motion.     Cervical back: Normal range of motion. No rigidity or tenderness.     Right lower leg: 1+ Edema present.     Left lower leg: 1+ Edema present.  Lymphadenopathy:     Cervical: No cervical adenopathy.  Skin:    General: Skin is warm and dry.     Capillary Refill: Capillary refill takes less than 2 seconds.  Neurological:     General: No focal deficit present.     Mental Status: She is alert and oriented to person, place, and time.  Psychiatric:        Mood and Affect: Mood normal.        Behavior: Behavior normal.        Thought Content: Thought content normal.        Judgment: Judgment normal.     There were no vitals taken for this visit. Wt Readings from Last 3 Encounters:  02/23/21 271 lb 9.6 oz (123.2 kg)  07/06/17 217 lb 3.2 oz (98.5 kg)  06/21/17 218 lb (98.9 kg)     Health Maintenance Due  Topic Date Due  . Hepatitis C Screening  Never done  . PAP SMEAR-Modifier  Never done  . COVID-19 Vaccine (3 - Booster for Pfizer series) 06/12/2020    There are no preventive care reminders to display for this patient.  No results found for: TSH Lab Results  Component Value Date   WBC 8.1 06/09/2017   HGB 12.6 06/09/2017   HCT 37.1 06/09/2017   MCV 86.6 06/09/2017   PLT 378 06/09/2017   Lab Results  Component Value Date   NA 137 06/09/2017   K 3.4 (L) 06/09/2017   CO2 27 06/09/2017   GLUCOSE 127 (H) 06/09/2017   BUN 15 06/09/2017   CREATININE 0.75 06/09/2017   BILITOT 0.2 (L) 06/09/2017   ALKPHOS 50 06/09/2017   AST 18 06/09/2017   ALT 17 06/09/2017   PROT 7.4 06/09/2017   ALBUMIN 3.8 06/09/2017   CALCIUM 8.5 (L) 06/09/2017   ANIONGAP 7 06/09/2017   No results found for: CHOL No results found for: HDL No results found for:  LDLCALC No results found for: TRIG No results found for: CHOLHDL No results found for: XVQM0Q    Assessment & Plan:   Problem List Items Addressed This Visit   None     No orders of the defined types were placed in this encounter.   Follow-up: No follow-ups on file.  Orma Render, NP

## 2021-03-09 NOTE — Assessment & Plan Note (Signed)
BP remains elevated today- 150/80. Home BP readings in 140-150's/80-90's. Will begin HCTZ 25mg  today Recommendations for diet and exercise provided. No alarm sx present- she does have some LE edema, but no other concerning findings, will monitor. F/U in 3 months. Will review labs drawn today and make changes to plan of care as necessary.

## 2021-03-10 LAB — THYROID PANEL WITH TSH
Free Thyroxine Index: 2 (ref 1.2–4.9)
T3 Uptake Ratio: 31 % (ref 24–39)
T4, Total: 6.3 ug/dL (ref 4.5–12.0)
TSH: 2.7 u[IU]/mL (ref 0.450–4.500)

## 2021-03-10 LAB — HEMOGLOBIN A1C
Hgb A1c MFr Bld: 6.3 % — ABNORMAL HIGH (ref 4.8–5.6)
Mean Plasma Glucose: 134 mg/dL

## 2021-03-10 NOTE — Progress Notes (Signed)
Please notify patient:  Elevation in A1c indicating pre-diabetes- this would be consistent with a possible diagnosis of PCOS given the other symptoms present. Need to request OB GYN results/records to see what testing they have done and consider adding Testosterone levels for further evaluation if not already completed.   Cholesterol is mildly elevated- in the setting of pre-diabetes, I would like to see the HDL a little higher and LDL lower. Recommend working on diet and exercise and we can recheck in 6 months. If LDL remains elevated, will consider statin therapy to reduce CV risks  Thyroid panel normal.  HIV and Hep C screening negative- no need to repeat unless risk factors or symptoms present.   Kidney and liver function look good. Calcium is a little low. Recommend daily calcium and Vitamin D3 supplement with Calcium 1000mg  and Vitamin D3 800iU to help bone health. These can be purchased OTC  Blood counts overall look good. Platelets are slightly elevated- this could indicate PCOS. We will plan to wait for additional testing results for PCOS and monitor.   Briana Barber, Please ask who she sees for GYN so we can request those records for further evaluation.

## 2021-03-11 ENCOUNTER — Telehealth (HOSPITAL_BASED_OUTPATIENT_CLINIC_OR_DEPARTMENT_OTHER): Payer: Self-pay

## 2021-03-11 NOTE — Telephone Encounter (Signed)
-----   Message from Tollie Eth, NP sent at 03/10/2021  9:20 AM EDT ----- Please notify patient:  Elevation in A1c indicating pre-diabetes- this would be consistent with a possible diagnosis of PCOS given the other symptoms present. Need to request OB GYN results/records to see what testing they have done and consider adding Testosterone levels for further evaluation if not already completed.   Cholesterol is mildly elevated- in the setting of pre-diabetes, I would like to see the HDL a little higher and LDL lower. Recommend working on diet and exercise and we can recheck in 6 months. If LDL remains elevated, will consider statin therapy to reduce CV risks  Thyroid panel normal.  HIV and Hep C screening negative- no need to repeat unless risk factors or symptoms present.   Kidney and liver function look good. Calcium is a little low. Recommend daily calcium and Vitamin D3 supplement with Calcium 1000mg  and Vitamin D3 800iU to help bone health. These can be purchased OTC  Blood counts overall look good. Platelets are slightly elevated- this could indicate PCOS. We will plan to wait for additional testing results for PCOS and monitor.   Briana Barber, Please ask who she sees for GYN so we can request those records for further evaluation.

## 2021-03-11 NOTE — Telephone Encounter (Signed)
-----   Message from Sara E Early, NP sent at 03/10/2021  9:20 AM EDT ----- Please notify patient:  Elevation in A1c indicating pre-diabetes- this would be consistent with a possible diagnosis of PCOS given the other symptoms present. Need to request OB GYN results/records to see what testing they have done and consider adding Testosterone levels for further evaluation if not already completed.   Cholesterol is mildly elevated- in the setting of pre-diabetes, I would like to see the HDL a little higher and LDL lower. Recommend working on diet and exercise and we can recheck in 6 months. If LDL remains elevated, will consider statin therapy to reduce CV risks  Thyroid panel normal.  HIV and Hep C screening negative- no need to repeat unless risk factors or symptoms present.   Kidney and liver function look good. Calcium is a little low. Recommend daily calcium and Vitamin D3 supplement with Calcium 1000mg and Vitamin D3 800iU to help bone health. These can be purchased OTC  Blood counts overall look good. Platelets are slightly elevated- this could indicate PCOS. We will plan to wait for additional testing results for PCOS and monitor.   Humaira Sculley, Please ask who she sees for GYN so we can request those records for further evaluation.      

## 2021-03-11 NOTE — Telephone Encounter (Signed)
Left message for patient to call back for results and recommendations. 

## 2021-03-11 NOTE — Telephone Encounter (Signed)
Patient called back to discussed lab results and recommendations.  She is aware and agreeable.  She will either come here to sign a medical release for [facility] (her GYN) or go there to sign a medical release to have her medical records sent here.  Instructed her to call the office with any questions or concerns.

## 2021-06-21 ENCOUNTER — Encounter (HOSPITAL_BASED_OUTPATIENT_CLINIC_OR_DEPARTMENT_OTHER): Payer: Self-pay | Admitting: Nurse Practitioner

## 2021-06-21 ENCOUNTER — Ambulatory Visit (HOSPITAL_BASED_OUTPATIENT_CLINIC_OR_DEPARTMENT_OTHER): Payer: 59 | Admitting: Nurse Practitioner

## 2021-06-21 ENCOUNTER — Other Ambulatory Visit: Payer: Self-pay

## 2021-06-21 VITALS — BP 138/90 | HR 102 | Resp 97 | Ht 64.0 in | Wt 255.8 lb

## 2021-06-21 DIAGNOSIS — I10 Essential (primary) hypertension: Secondary | ICD-10-CM

## 2021-06-21 DIAGNOSIS — K582 Mixed irritable bowel syndrome: Secondary | ICD-10-CM | POA: Insufficient documentation

## 2021-06-21 DIAGNOSIS — R7303 Prediabetes: Secondary | ICD-10-CM | POA: Diagnosis not present

## 2021-06-21 DIAGNOSIS — Z6841 Body Mass Index (BMI) 40.0 and over, adult: Secondary | ICD-10-CM | POA: Diagnosis not present

## 2021-06-21 DIAGNOSIS — E282 Polycystic ovarian syndrome: Secondary | ICD-10-CM

## 2021-06-21 LAB — POCT UA - MICROALBUMIN
Creatinine, POC: 300 mg/dL
Microalbumin Ur, POC: 80 mg/L

## 2021-06-21 MED ORDER — METFORMIN HCL 500 MG PO TABS: 500.0000 mg | ORAL_TABLET | Freq: Two times a day (BID) | ORAL | 3 refills | Status: DC

## 2021-06-21 MED ORDER — DICYCLOMINE HCL 10 MG PO CAPS: 10.0000 mg | ORAL_CAPSULE | Freq: Two times a day (BID) | ORAL | 6 refills | Status: DC | PRN

## 2021-06-21 MED ORDER — LISINOPRIL-HYDROCHLOROTHIAZIDE 10-12.5 MG PO TABS: 1.0000 | ORAL_TABLET | Freq: Every day | ORAL | 3 refills | Status: DC

## 2021-06-21 NOTE — Assessment & Plan Note (Signed)
Symptoms and presentation consistent with IBS with alternating constipation and diarrhea. Discussed with patient that okay to use Pepto-Bismol for intermittent symptoms. No alarm symptoms present today. Also discussed with patient to monitor foods that seem to trigger.  Information provided on FODMAP diet. As needed Bentyl provided for symptoms not controlled with Pepto-Bismol. If symptoms worsen or fail to improve will consider GI referral.

## 2021-06-21 NOTE — Patient Instructions (Addendum)
Recommendations from today:  Change medication to Lisinopril-HCTZ combo. You can stop the regular HCTZ tablet you have been taking. This will help with your blood pressure and with kidney protection since your blood sugars are running higher.   We will start metformin for blood sugar and PCOS. Let me know if you have side effects from this.  We will check your hemoglobin A1c today and we can recheck this in about 6 months to see how the medication is working for you.   I have sent in Bentyl that you can try for stomach cramping if Pepto is not effective.

## 2021-06-21 NOTE — Assessment & Plan Note (Signed)
Recent labs show diagnosis of prediabetes with A1c of 5.6. In the setting of PCOS discussed with patient the option to start metformin low-dose twice a day and she is agreeable to this. Recommend monitoring diet and avoiding foods high in carbohydrates. Recommend diet of no more than 1500 cal/day without her 50 g of carbohydrates. Start metformin twice daily. At next visit she will need CBC, CMP, A1c monitor. Follow-up in 3 months

## 2021-06-21 NOTE — Progress Notes (Signed)
Established Patient Office Visit  Subjective:  Patient ID: Briana Barber, female    DOB: 03/29/1991  Age: 30 y.o. MRN: [id number]  CC:  Chief Complaint  Patient presents with   Follow-up   Hypertension    HPI Briana Barber presents for follow-up for HTN and Pre-DM  HYPERTENSION Hypertension status: uncontrolled  Satisfied with current treatment? yes Duration of hypertension: months BP monitoring frequency:  a few times a month BP range: 130s systolic BP medication side effects:  no Medication compliance: excellent compliance Aspirin: no Recurrent headaches: no Visual changes: no Palpitations: no Dyspnea: no Chest pain: no Lower extremity edema: no Dizzy/lightheaded: no  PREDIABETES Last A1c: 03/09/21 Next A1c Due: Today Medications: None S/E: N/A Monitoring BG: Symptoms Present: No increased hunger, thirst, or urination HTN: Y HLD: LDL not within goal  PCOS Patient reports recent diagnosis with PCOS at OB/GYN.  She was started on oral contraceptives which have been helpful in regulating her cycle.  IBS Patient reports intermittent concerns with irritable bowel like symptoms of abdominal cramping and pain that is resolved with bowel movement and diarrhea. She cannot identify any specific foods that trigger this but does feel that it is worse at times with increased rest and anxiety. She occasionally takes Pepto-Bismol which does help with symptoms.  Past Medical History:  Diagnosis Date   Anxiety    no meds   Cholelithiasis    Irritable bowel syndrome (IBS) 2018   Need for diphtheria-tetanus-pertussis (Tdap) vaccine 02/23/2021   Right upper quadrant abdominal pain 01/18/2017   Screening for cholesterol level 02/23/2021   Screening for deficiency anemia 02/23/2021   Screening for diabetes mellitus 02/23/2021   Screening for endocrine, nutritional, metabolic and immunity disorder 02/23/2021    Past Surgical History:  Procedure Laterality Date    CHOLECYSTECTOMY N/A 06/21/2017   Procedure: LAPAROSCOPIC CHOLECYSTECTOMY;  Surgeon: Ancil Linsey, MD;  Location: [facility] ORS;  Service: General;  Laterality: N/A;   NO PAST SURGERIES      Family History  Problem Relation Age of Onset   Hypertension Mother    Cancer Maternal Grandmother    Hypertension Maternal Grandmother    Colon cancer Maternal Grandmother    Heart disease Maternal Grandfather    Hypertension Maternal Grandfather     Social History   Socioeconomic History   Marital status: Significant Other    Spouse name: Not on file   Number of children: Not on file   Years of education: Not on file   Highest education level: Not on file  Occupational History   Occupation: Insurance claims handler    Comment: [employer]  Tobacco Use   Smoking status: Never   Smokeless tobacco: Never  Vaping Use   Vaping Use: Never used  Substance and Sexual Activity   Alcohol use: Never    Comment: rare   Drug use: Never   Sexual activity: Yes    Partners: Male    Birth control/protection: Condom  Other Topics Concern   Not on file  Social History Narrative   Not on file   Social Determinants of Health   Financial Resource Strain: Not on file  Food Insecurity: Not on file  Transportation Needs: No Transportation Needs   Lack of Transportation (Medical): No   Lack of Transportation (Non-Medical): No  Physical Activity: Insufficiently Active   Days of Exercise per Week: 3 days   Minutes of Exercise per Session: 30 min  Stress: No Stress Concern Present   Feeling  of Stress : Not at all  Social Connections: Moderately Integrated   Frequency of Communication with Friends and Family: More than three times a week   Frequency of Social Gatherings with Friends and Family: More than three times a week   Attends Religious Services: More than 4 times per year   Active Member of [employer] FinancialClubs or Organizations: Yes   Attends Engineer, structuralClub or Organization Meetings: More than 4 times per year    Marital Status: Never married  Catering managerntimate Partner Violence: Not At Risk   Fear of Current or Ex-Partner: No   Emotionally Abused: No   Physically Abused: No   Sexually Abused: No    Outpatient Medications Prior to Visit  Medication Sig Dispense Refill   norgestimate-ethinyl estradiol (ORTHO-CYCLEN) 0.25-35 MG-MCG tablet Take 1 tablet by mouth daily.     hydrochlorothiazide (HYDRODIURIL) 25 MG tablet Take 1 tablet (25 mg total) by mouth daily. 30 tablet 4   No facility-administered medications prior to visit.    No Known Allergies  ROS Review of Systems All review of systems negative except what is listed in the HPI    Objective:    Physical Exam Vitals and nursing note reviewed.  Constitutional:      Appearance: Normal appearance.  HENT:     Head: Normocephalic.  Eyes:     Extraocular Movements: Extraocular movements intact.     Conjunctiva/sclera: Conjunctivae normal.     Pupils: Pupils are equal, round, and reactive to light.  Neck:     Vascular: No carotid bruit.  Cardiovascular:     Rate and Rhythm: Normal rate and regular rhythm.     Pulses: Normal pulses.     Heart sounds: Normal heart sounds.  Pulmonary:     Effort: Pulmonary effort is normal.     Breath sounds: Normal breath sounds.  Musculoskeletal:     Cervical back: Normal range of motion.     Right lower leg: No edema.     Left lower leg: No edema.  Skin:    General: Skin is warm and dry.     Capillary Refill: Capillary refill takes less than 2 seconds.  Neurological:     General: No focal deficit present.     Mental Status: She is alert and oriented to person, place, and time.  Psychiatric:        Mood and Affect: Mood normal.        Behavior: Behavior normal.        Thought Content: Thought content normal.        Judgment: Judgment normal.    BP 138/90   Pulse (!) 102   Resp (!) 97   Ht 5\' 4"  (1.626 m)   Wt 255 lb 12.8 oz (116 kg)   BMI 43.91 kg/m  Wt Readings from Last 3 Encounters:   06/21/21 255 lb 12.8 oz (116 kg)  03/09/21 272 lb (123.4 kg)  02/23/21 271 lb 9.6 oz (123.2 kg)     Health Maintenance Due  Topic Date Due   PAP SMEAR-Modifier  Never done   COVID-19 Vaccine (3 - Booster for Pfizer series) 06/12/2020    There are no preventive care reminders to display for this patient.  Lab Results  Component Value Date   TSH 2.700 03/09/2021   Lab Results  Component Value Date   WBC 7.8 03/09/2021   HGB 12.5 03/09/2021   HCT 38.7 03/09/2021   MCV 86.2 03/09/2021   PLT 418 (H) 03/09/2021   Lab Results  Component Value Date   NA 138 03/09/2021   K 3.8 03/09/2021   CO2 27 03/09/2021   GLUCOSE 87 03/09/2021   BUN 13 03/09/2021   CREATININE 0.83 03/09/2021   BILITOT 0.4 03/09/2021   ALKPHOS 53 03/09/2021   AST 20 03/09/2021   ALT 31 03/09/2021   PROT 7.1 03/09/2021   ALBUMIN 4.1 03/09/2021   CALCIUM 8.8 (L) 03/09/2021   ANIONGAP 9 03/09/2021   Lab Results  Component Value Date   CHOL 183 03/09/2021   Lab Results  Component Value Date   HDL 42 03/09/2021   Lab Results  Component Value Date   LDLCALC 124 (H) 03/09/2021   Lab Results  Component Value Date   TRIG 83 03/09/2021   Lab Results  Component Value Date   CHOLHDL 4.4 03/09/2021   Lab Results  Component Value Date   HGBA1C 6.3 (H) 03/09/2021      Assessment & Plan:   Problem List Items Addressed This Visit     BMI 45.0-49.9, adult (HCC)    Recommend daily activity of at least 20 minutes walking per day. Recommend monitoring diet and reducing saturated fats and avoiding carbohydrates. Diet should consist of no more than 1500 cal/day with 150 g of carbohydrates. I am hopeful that after starting metformin and having PCOS under control she will be able to lose some of the weight she has been working towards.      Relevant Medications   metFORMIN (GLUCOPHAGE) 500 MG tablet   Hypertension - Primary    Blood pressure not well controlled today.  On recheck blood pressure  138/90. Discussed with patient the need to change medication. Will add lisinopril-hydrochlorothiazide 10-12.5 mg to add kidney protection due to prediabetes and see if we can get blood pressures under better control. Recommend monitoring blood pressure at home at least 3 times a week and report any elevations greater than 150/90. Plan to follow-up in 3 months.  Will need CBC, CMP, A1c at that time.      Relevant Medications   lisinopril-hydrochlorothiazide (ZESTORETIC) 10-12.5 MG tablet   Pre-diabetes    Recent labs show diagnosis of prediabetes with A1c of 5.6. In the setting of PCOS discussed with patient the option to start metformin low-dose twice a day and she is agreeable to this. Recommend monitoring diet and avoiding foods high in carbohydrates. Recommend diet of no more than 1500 cal/day without her 50 g of carbohydrates. Start metformin twice daily. At next visit she will need CBC, CMP, A1c monitor. Follow-up in 3 months      Relevant Medications   metFORMIN (GLUCOPHAGE) 500 MG tablet   PCOS (polycystic ovarian syndrome)    Recent diagnosis of PCOS with OB/GYN. Given her elevated A1c discussed with option to begin low-dose metformin today.  Patient is agreeable to this. We will plan to begin 500 mg metformin every morning and every evening to help with blood glucose. Recommend monitoring diet and increasing activity level to a minimum of 20 minutes daily. Will need A1c, CBC, CMP at next visit.  Follow-up in 3 months or sooner if needed.      Relevant Medications   metFORMIN (GLUCOPHAGE) 500 MG tablet   Irritable bowel syndrome with both constipation and diarrhea    Symptoms and presentation consistent with IBS with alternating constipation and diarrhea. Discussed with patient that okay to use Pepto-Bismol for intermittent symptoms. No alarm symptoms present today. Also discussed with patient to monitor foods that seem to trigger.  Information  provided on FODMAP diet. As  needed Bentyl provided for symptoms not controlled with Pepto-Bismol. If symptoms worsen or fail to improve will consider GI referral.      Relevant Medications   dicyclomine (BENTYL) 10 MG capsule    Meds ordered this encounter  Medications   lisinopril-hydrochlorothiazide (ZESTORETIC) 10-12.5 MG tablet    Sig: Take 1 tablet by mouth daily.    Dispense:  90 tablet    Refill:  3   metFORMIN (GLUCOPHAGE) 500 MG tablet    Sig: Take 1 tablet (500 mg total) by mouth 2 (two) times daily with a meal.    Dispense:  180 tablet    Refill:  3   dicyclomine (BENTYL) 10 MG capsule    Sig: Take 1 capsule (10 mg total) by mouth 3 times/day as needed-between meals & bedtime (intestinal spasms).    Dispense:  60 capsule    Refill:  6    Follow-up: Return in about 3 months (around 09/20/2021) for HTN and Metformin .    Tollie Eth, NP

## 2021-06-21 NOTE — Addendum Note (Signed)
Addended by: Agapito Games D on: 06/21/2021 03:01 PM   Modules accepted: Orders

## 2021-06-21 NOTE — Assessment & Plan Note (Signed)
Recommend daily activity of at least 20 minutes walking per day. Recommend monitoring diet and reducing saturated fats and avoiding carbohydrates. Diet should consist of no more than 1500 cal/day with 150 g of carbohydrates. I am hopeful that after starting metformin and having PCOS under control she will be able to lose some of the weight she has been working towards.

## 2021-06-21 NOTE — Assessment & Plan Note (Addendum)
Blood pressure not well controlled today.  On recheck blood pressure 138/90. Discussed with patient the need to change medication. Will add lisinopril-hydrochlorothiazide 10-12.5 mg to add kidney protection due to prediabetes and see if we can get blood pressures under better control. Recommend monitoring blood pressure at home at least 3 times a week and report any elevations greater than 150/90. Plan to follow-up in 3 months.  Will need CBC, CMP, A1c at that time.

## 2021-06-21 NOTE — Assessment & Plan Note (Signed)
Recent diagnosis of PCOS with OB/GYN. Given her elevated A1c discussed with option to begin low-dose metformin today.  Patient is agreeable to this. We will plan to begin 500 mg metformin every morning and every evening to help with blood glucose. Recommend monitoring diet and increasing activity level to a minimum of 20 minutes daily. Will need A1c, CBC, CMP at next visit.  Follow-up in 3 months or sooner if needed.

## 2021-07-06 ENCOUNTER — Telehealth (HOSPITAL_BASED_OUTPATIENT_CLINIC_OR_DEPARTMENT_OTHER): Payer: Self-pay

## 2021-07-06 ENCOUNTER — Encounter (HOSPITAL_BASED_OUTPATIENT_CLINIC_OR_DEPARTMENT_OTHER): Payer: Self-pay | Admitting: Nurse Practitioner

## 2021-07-06 NOTE — Telephone Encounter (Signed)
Patient is calling regarding Metformin.  She reports she has been having stomach issues since she's been taking it.  She is feeling nauseous, has diarrhea,  and stomach pains and cramps.  She has stopped the medication on Friday and all symptoms have resolved.  She would like call back from you.

## 2021-07-07 ENCOUNTER — Other Ambulatory Visit (HOSPITAL_BASED_OUTPATIENT_CLINIC_OR_DEPARTMENT_OTHER): Payer: Self-pay | Admitting: Nurse Practitioner

## 2021-07-07 ENCOUNTER — Encounter (HOSPITAL_BASED_OUTPATIENT_CLINIC_OR_DEPARTMENT_OTHER): Payer: Self-pay | Admitting: Nurse Practitioner

## 2021-09-20 ENCOUNTER — Ambulatory Visit (HOSPITAL_BASED_OUTPATIENT_CLINIC_OR_DEPARTMENT_OTHER): Payer: 59 | Admitting: Nurse Practitioner

## 2021-09-22 ENCOUNTER — Encounter (HOSPITAL_BASED_OUTPATIENT_CLINIC_OR_DEPARTMENT_OTHER): Payer: Self-pay | Admitting: Nurse Practitioner

## 2021-09-22 ENCOUNTER — Other Ambulatory Visit: Payer: Self-pay

## 2021-09-22 ENCOUNTER — Ambulatory Visit (HOSPITAL_BASED_OUTPATIENT_CLINIC_OR_DEPARTMENT_OTHER): Payer: 59 | Admitting: Nurse Practitioner

## 2021-09-22 VITALS — BP 128/82 | HR 96 | Ht 64.0 in | Wt 247.0 lb

## 2021-09-22 DIAGNOSIS — Z6841 Body Mass Index (BMI) 40.0 and over, adult: Secondary | ICD-10-CM | POA: Diagnosis not present

## 2021-09-22 DIAGNOSIS — R7303 Prediabetes: Secondary | ICD-10-CM | POA: Diagnosis not present

## 2021-09-22 DIAGNOSIS — I10 Essential (primary) hypertension: Secondary | ICD-10-CM | POA: Diagnosis not present

## 2021-09-22 DIAGNOSIS — E282 Polycystic ovarian syndrome: Secondary | ICD-10-CM

## 2021-09-22 DIAGNOSIS — E782 Mixed hyperlipidemia: Secondary | ICD-10-CM

## 2021-09-22 MED ORDER — ONDANSETRON HCL 4 MG PO TABS: 4.0000 mg | ORAL_TABLET | Freq: Three times a day (TID) | ORAL | 3 refills | Status: AC | PRN

## 2021-09-22 MED ORDER — RYBELSUS 3 MG PO TABS: 3.0000 mg | ORAL_TABLET | Freq: Every day | ORAL | 0 refills | Status: DC

## 2021-09-22 MED ORDER — RYBELSUS 7 MG PO TABS: 7.0000 mg | ORAL_TABLET | Freq: Every day | ORAL | 0 refills | Status: DC

## 2021-09-22 NOTE — Progress Notes (Signed)
Established Patient Office Visit  Subjective:  Patient ID: Briana Barber, female    DOB: 11-22-1990  Age: 30 y.o. MRN: [id number]  CC:  Chief Complaint  Patient presents with   Follow-up    Patient came in the office today for a 3 month follow up for high blood pressure and diabetes. Patient has stopped taking the metformin medication X 2 weeks due to nausea and upset stomach (diarrhea). She would like to discuss other options for medication. No refills are needed today.     HPI Briana Barber presents for follow-up for pre-diabetes and HTN.   Pre-DM Her last A1c was found to be elevated at 6.3% and she was started on metformin.  She reports taking the metformin daily, but was having severe nausea and diarrhea and had to stop the medication due to side effects.  She has not had increased hunger, thirst, or urination since stopping the medication. She has no slow healing wounds or signs of yeast growth.  She is not checking her BG at home.  She was walking daily, but has not walked as much in the past month as she was busy with her wedding.  She also reports her eating habits have not been as good since the holidays and her wedding.  HTN Taking lisinopril-HCTZ 10-12.5 daily No headaches, chest pain, edema, vision changes, dizziness, or palpitations.  She is taking the medication daily without missed doses She is not checking her BP at home  She has a history of PCOS anad has been managed by GYN, but she would like to transfer her care to Dr. Hyacinth Meeker, if possible, to have her providers in one location on the same system for congruence of care.    Past Medical History:  Diagnosis Date   Anxiety    no meds   Cholelithiasis    Irritable bowel syndrome (IBS) 2018   PCOS (polycystic ovarian syndrome)     Past Surgical History:  Procedure Laterality Date   CHOLECYSTECTOMY N/A 06/21/2017   Procedure: LAPAROSCOPIC CHOLECYSTECTOMY;  Surgeon: Ancil Linsey, MD;  Location: [facility]  ORS;  Service: General;  Laterality: N/A;   NO PAST SURGERIES      Family History  Problem Relation Age of Onset   Hypertension Mother    Cancer Maternal Grandmother    Hypertension Maternal Grandmother    Colon cancer Maternal Grandmother    Heart disease Maternal Grandfather    Hypertension Maternal Grandfather     Social History   Socioeconomic History   Marital status: Married    Spouse name: Not on file   Number of children: Not on file   Years of education: Not on file   Highest education level: Not on file  Occupational History   Occupation: Insurance claims handler    Comment: [employer]  Tobacco Use   Smoking status: Never   Smokeless tobacco: Never  Vaping Use   Vaping Use: Never used  Substance and Sexual Activity   Alcohol use: Never    Comment: rare   Drug use: Never   Sexual activity: Yes    Partners: Male    Birth control/protection: Condom  Other Topics Concern   Not on file  Social History Narrative   Not on file   Social Determinants of Health   Financial Resource Strain: Not on file  Food Insecurity: Not on file  Transportation Needs: No Transportation Needs   Lack of Transportation (Medical): No   Lack of Transportation (Non-Medical): No  Physical Activity: Insufficiently Active   Days of Exercise per Week: 3 days   Minutes of Exercise per Session: 30 min  Stress: No Stress Concern Present   Feeling of Stress : Not at all  Social Connections: Moderately Integrated   Frequency of Communication with Friends and Family: More than three times a week   Frequency of Social Gatherings with Friends and Family: More than three times a week   Attends Religious Services: More than 4 times per year   Active Member of [employer] or Organizations: Yes   Attends Engineer, structural: More than 4 times per year   Marital Status: Never married  Catering manager Violence: Not At Risk   Fear of Current or Ex-Partner: No   Emotionally Abused:  No   Physically Abused: No   Sexually Abused: No    Outpatient Medications Prior to Visit  Medication Sig Dispense Refill   dicyclomine (BENTYL) 10 MG capsule Take 1 capsule (10 mg total) by mouth 3 times/day as needed-between meals & bedtime (intestinal spasms). 60 capsule 6   lisinopril-hydrochlorothiazide (ZESTORETIC) 10-12.5 MG tablet Take 1 tablet by mouth daily. 90 tablet 3   metFORMIN (GLUCOPHAGE) 500 MG tablet Take 1 tablet (500 mg total) by mouth 2 (two) times daily with a meal. 180 tablet 3   norgestimate-ethinyl estradiol (ORTHO-CYCLEN) 0.25-35 MG-MCG tablet Take 1 tablet by mouth daily.     No facility-administered medications prior to visit.    No Known Allergies  ROS Review of Systems All review of systems negative except what is listed in the HPI    Objective:    Physical Exam Vitals and nursing note reviewed.  Constitutional:      Appearance: Normal appearance.  HENT:     Head: Normocephalic.  Eyes:     Extraocular Movements: Extraocular movements intact.     Conjunctiva/sclera: Conjunctivae normal.     Pupils: Pupils are equal, round, and reactive to light.  Neck:     Vascular: No carotid bruit.  Cardiovascular:     Rate and Rhythm: Normal rate and regular rhythm.     Pulses: Normal pulses.     Heart sounds: Normal heart sounds.  Pulmonary:     Effort: Pulmonary effort is normal.     Breath sounds: Normal breath sounds.  Abdominal:     General: Bowel sounds are normal.     Palpations: Abdomen is soft.  Musculoskeletal:     Cervical back: Normal range of motion.     Right lower leg: No edema.     Left lower leg: No edema.  Skin:    General: Skin is warm and dry.     Capillary Refill: Capillary refill takes less than 2 seconds.  Neurological:     General: No focal deficit present.     Mental Status: She is alert and oriented to person, place, and time.     Sensory: No sensory deficit.  Psychiatric:        Mood and Affect: Mood normal.         Behavior: Behavior normal.        Thought Content: Thought content normal.        Judgment: Judgment normal.    BP 128/82    Pulse 96    Ht 5\' 4"  (1.626 m)    Wt 247 lb (112 kg)    SpO2 97%    BMI 42.40 kg/m  Wt Readings from Last 3 Encounters:  09/22/21 247 lb (112 kg)  06/21/21 255 lb 12.8 oz (116 kg)  03/09/21 272 lb (123.4 kg)     Health Maintenance Due  Topic Date Due   COVID-19 Vaccine (3 - Booster for Pfizer series) 03/07/2020   INFLUENZA VACCINE  Never done    There are no preventive care reminders to display for this patient.  Lab Results  Component Value Date   TSH 2.700 03/09/2021   Lab Results  Component Value Date   WBC 7.8 03/09/2021   HGB 12.5 03/09/2021   HCT 38.7 03/09/2021   MCV 86.2 03/09/2021   PLT 418 (H) 03/09/2021   Lab Results  Component Value Date   NA 138 03/09/2021   K 3.8 03/09/2021   CO2 27 03/09/2021   GLUCOSE 87 03/09/2021   BUN 13 03/09/2021   CREATININE 0.83 03/09/2021   BILITOT 0.4 03/09/2021   ALKPHOS 53 03/09/2021   AST 20 03/09/2021   ALT 31 03/09/2021   PROT 7.1 03/09/2021   ALBUMIN 4.1 03/09/2021   CALCIUM 8.8 (L) 03/09/2021   ANIONGAP 9 03/09/2021   Lab Results  Component Value Date   CHOL 183 03/09/2021   Lab Results  Component Value Date   HDL 42 03/09/2021   Lab Results  Component Value Date   LDLCALC 124 (H) 03/09/2021   Lab Results  Component Value Date   TRIG 83 03/09/2021   Lab Results  Component Value Date   CHOLHDL 4.4 03/09/2021   Lab Results  Component Value Date   HGBA1C 6.3 (H) 03/09/2021      Assessment & Plan:   Problem List Items Addressed This Visit     BMI 45.0-49.9, adult (HCC)    BMI 42.40 today Unfortunately, she was unable to tolerate metformin and had to stop this medication due to GI distress.  She has also not been exercising as much lately or monitoring her diet.  Recommend restarting exercise for at least 20 minute walks a day. Dietary changes recommended include  1500cal with no more than 150g carbohydrates and 13g of saturated fat. Also recommend increase fiber intake to at least 30g per day. Discussion to start Rybelsus to help with blood glucose, weight, lipids, and htn. She is agreeable to begin this today- she would like to avoid injectable if possible.  Will plan to follow-up in 3 months to see how she is doing since new medication start.       Relevant Medications   Semaglutide (RYBELSUS) 3 MG TABS   Semaglutide (RYBELSUS) 7 MG TABS   Other Relevant Orders   CBC with Differential/Platelet   Comprehensive metabolic panel   Lipid panel   Hemoglobin A1c   Primary hypertension - Primary    Much improved BP control at [address] today. She is fasting and has not had her medication today.  Discussed increasing activity again and monitoring diet to decrease saturated fats to no more than 13g per day and carbohydrates to no more than 150g per day. Recommend increased daily fiber of at least 30g.  Will obtain labs today for evaluation No alarm symptoms present F/U in 3 months or sooner if needed.       Relevant Medications   Semaglutide (RYBELSUS) 3 MG TABS   Semaglutide (RYBELSUS) 7 MG TABS   Other Relevant Orders   CBC with Differential/Platelet   Comprehensive metabolic panel   Lipid panel   Hemoglobin A1c   Pre-diabetes    Last A1c 6.3%. Patient unable to tolerate metformin due to severe GI side effects.  Discussion on medication options included GLP-1 for CV, weight, and glucose control. She is interested in this today, however is hesitant to start an injectable.  Will plan to start Rybelsus once daily and increase physical activity as well as monitor diet.  No more than 150g of carbohydrates and 13g of saturated fat per day with at least 30g fiber recommended to help improve control.  F/U in 3 months to see how she is doing on the medication       Relevant Medications   Semaglutide (RYBELSUS) 3 MG TABS   Semaglutide (RYBELSUS) 7 MG TABS    ondansetron (ZOFRAN) 4 MG tablet   Other Relevant Orders   CBC with Differential/Platelet   Comprehensive metabolic panel   Lipid panel   Hemoglobin A1c   Ambulatory referral to Obstetrics / Gynecology   PCOS (polycystic ovarian syndrome)    History of PCOS with OCP management at this time Unable to tolerate metformin.  May consider addition of spironolactone for hirsutism and additional BP control- will discuss with patient at next visit.  Will send referral to Dr. Hyacinth Meeker as patient would like providers in the same location No alarm symptoms present today Will monitor.        Relevant Medications   Semaglutide (RYBELSUS) 3 MG TABS   Semaglutide (RYBELSUS) 7 MG TABS   Other Relevant Orders   CBC with Differential/Platelet   Comprehensive metabolic panel   Lipid panel   Hemoglobin A1c   Ambulatory referral to Obstetrics / Gynecology   Other Visit Diagnoses     BMI 40.0-44.9, adult (HCC)       Relevant Medications   Semaglutide (RYBELSUS) 3 MG TABS   Semaglutide (RYBELSUS) 7 MG TABS   Other Relevant Orders   CBC with Differential/Platelet   Comprehensive metabolic panel   Lipid panel   Hemoglobin A1c       Meds ordered this encounter  Medications   Semaglutide (RYBELSUS) 3 MG TABS    Sig: Take 3 mg by mouth daily. Start with this dose then increase to  after 30 days.    Dispense:  30 tablet    Refill:  0   Semaglutide (RYBELSUS) 7 MG TABS    Sig: Take 7 mg by mouth daily. Start this dose after completing  dose.    Dispense:  60 tablet    Refill:  0   ondansetron (ZOFRAN) 4 MG tablet    Sig: Take 1 tablet (4 mg total) by mouth every 8 (eight) hours as needed for nausea or vomiting.    Dispense:  30 tablet    Refill:  3    Follow-up: Return in about 3 months (around 12/21/2021) for Phone visit to see how Rybelsus is working.    Tollie Eth, NP

## 2021-09-22 NOTE — Assessment & Plan Note (Signed)
Much improved BP control at 12882 today. She is fasting and has not had her medication today.  Discussed increasing activity again and monitoring diet to decrease saturated fats to no more than 13g per day and carbohydrates to no more than 150g per day. Recommend increased daily fiber of at least 30g.  Will obtain labs today for evaluation No alarm symptoms present F/U in 3 months or sooner if needed.

## 2021-09-22 NOTE — Assessment & Plan Note (Signed)
Last A1c 6.3%. Patient unable to tolerate metformin due to severe GI side effects.  Discussion on medication options included GLP-1 for CV, weight, and glucose control. She is interested in this today, however is hesitant to start an injectable.  Will plan to start Rybelsus once daily and increase physical activity as well as monitor diet.  No more than 150g of carbohydrates and 13g of saturated fat per day with at least 30g fiber recommended to help improve control.  F/U in 3 months to see how she is doing on the medication

## 2021-09-22 NOTE — Assessment & Plan Note (Signed)
History of PCOS with OCP management at this time Unable to tolerate metformin.  May consider addition of spironolactone for hirsutism and additional BP control- will discuss with patient at next visit.  Will send referral to Dr. Hyacinth Meeker as patient would like providers in the same location No alarm symptoms present today Will monitor.

## 2021-09-22 NOTE — Assessment & Plan Note (Signed)
BMI 42.40 today Unfortunately, she was unable to tolerate metformin and had to stop this medication due to GI distress.  She has also not been exercising as much lately or monitoring her diet.  Recommend restarting exercise for at least 20 minute walks a day. Dietary changes recommended include 1500cal with no more than 150g carbohydrates and 13g of saturated fat. Also recommend increase fiber intake to at least 30g per day. Discussion to start Rybelsus to help with blood glucose, weight, lipids, and htn. She is agreeable to begin this today- she would like to avoid injectable if possible.  Will plan to follow-up in 3 months to see how she is doing since new medication start.

## 2021-09-22 NOTE — Patient Instructions (Addendum)
I am so excited with how well your blood pressure is doing!! Way to go!!  We will check your labs again today to check your average blood sugar levels.  We will also make sure your kidney and liver function look good and your electrolytes are balanced. I will also recheck your cholesterol.   Diabetes, High Blood Pressure, and High Cholesterol all go together to increase the risk of damage to your blood vessels that can cause heart disease and stroke. We want to make sure that we keep a close watch on these to ensure that we are doing all we can to keep you healthy.   We will contact you once we have reviewed the results and let you know if we need to make any changes.   I have sent in the prescription for Rybelsus and included information on the medication on the back of this form.

## 2021-09-23 LAB — CBC WITH DIFFERENTIAL/PLATELET
Basophils Absolute: 0 10*3/uL (ref 0.0–0.2)
Basos: 0 %
EOS (ABSOLUTE): 0.1 10*3/uL (ref 0.0–0.4)
Eos: 1 %
Hematocrit: 40.8 % (ref 34.0–46.6)
Hemoglobin: 13.3 g/dL (ref 11.1–15.9)
Immature Grans (Abs): 0 10*3/uL (ref 0.0–0.1)
Immature Granulocytes: 0 %
Lymphocytes Absolute: 2.2 10*3/uL (ref 0.7–3.1)
Lymphs: 30 %
MCH: 28.5 pg (ref 26.6–33.0)
MCHC: 32.6 g/dL (ref 31.5–35.7)
MCV: 87 fL (ref 79–97)
Monocytes Absolute: 0.3 10*3/uL (ref 0.1–0.9)
Monocytes: 5 %
Neutrophils Absolute: 4.5 10*3/uL (ref 1.4–7.0)
Neutrophils: 64 %
Platelets: 397 10*3/uL (ref 150–450)
RBC: 4.67 x10E6/uL (ref 3.77–5.28)
RDW: 14.2 % (ref 11.7–15.4)
WBC: 7.2 10*3/uL (ref 3.4–10.8)

## 2021-09-23 LAB — COMPREHENSIVE METABOLIC PANEL
ALT: 42 IU/L — ABNORMAL HIGH (ref 0–32)
AST: 24 IU/L (ref 0–40)
Albumin/Globulin Ratio: 1.5 (ref 1.2–2.2)
Albumin: 4.5 g/dL (ref 3.8–4.8)
Alkaline Phosphatase: 60 IU/L (ref 44–121)
BUN/Creatinine Ratio: 11 (ref 9–23)
BUN: 11 mg/dL (ref 6–20)
Bilirubin Total: <0.2 mg/dL (ref 0.0–1.2)
CO2: 21 mmol/L (ref 20–29)
Calcium: 9.6 mg/dL (ref 8.7–10.2)
Chloride: 102 mmol/L (ref 96–106)
Creatinine, Ser: 0.97 mg/dL (ref 0.57–1.00)
Globulin, Total: 3.1 g/dL (ref 1.5–4.5)
Glucose: 91 mg/dL (ref 70–99)
Potassium: 4.2 mmol/L (ref 3.5–5.2)
Sodium: 137 mmol/L (ref 134–144)
Total Protein: 7.6 g/dL (ref 6.0–8.5)
eGFR: 80 mL/min/{1.73_m2} (ref >59)

## 2021-09-23 LAB — LIPID PANEL
Chol/HDL Ratio: 4.3 ratio (ref 0.0–4.4)
Cholesterol, Total: 204 mg/dL — ABNORMAL HIGH (ref 100–199)
HDL: 48 mg/dL (ref >39)
LDL Chol Calc (NIH): 140 mg/dL — ABNORMAL HIGH (ref 0–99)
Triglycerides: 87 mg/dL (ref 0–149)
VLDL Cholesterol Cal: 16 mg/dL (ref 5–40)

## 2021-09-23 LAB — HEMOGLOBIN A1C
Est. average glucose Bld gHb Est-mCnc: 128 mg/dL
Hgb A1c MFr Bld: 6.1 % — ABNORMAL HIGH (ref 4.8–5.6)

## 2021-09-28 NOTE — Addendum Note (Signed)
Addended by: Solan Vosler, Huntley Dec E on: 09/28/2021 09:22 AM   Modules accepted: Orders

## 2021-12-21 ENCOUNTER — Telehealth (HOSPITAL_BASED_OUTPATIENT_CLINIC_OR_DEPARTMENT_OTHER): Payer: 59 | Admitting: Nurse Practitioner

## 2021-12-27 ENCOUNTER — Telehealth (HOSPITAL_BASED_OUTPATIENT_CLINIC_OR_DEPARTMENT_OTHER): Payer: Self-pay | Admitting: Nurse Practitioner

## 2021-12-27 ENCOUNTER — Other Ambulatory Visit: Payer: Self-pay

## 2022-01-03 ENCOUNTER — Ambulatory Visit (INDEPENDENT_AMBULATORY_CARE_PROVIDER_SITE_OTHER): Payer: BC Managed Care – PPO | Admitting: Nurse Practitioner

## 2022-01-03 ENCOUNTER — Other Ambulatory Visit: Payer: Self-pay

## 2022-01-06 ENCOUNTER — Other Ambulatory Visit (HOSPITAL_BASED_OUTPATIENT_CLINIC_OR_DEPARTMENT_OTHER): Payer: Self-pay | Admitting: Nurse Practitioner

## 2022-01-06 DIAGNOSIS — R7303 Prediabetes: Secondary | ICD-10-CM

## 2022-01-06 DIAGNOSIS — Z6841 Body Mass Index (BMI) 40.0 and over, adult: Secondary | ICD-10-CM

## 2022-01-06 DIAGNOSIS — I10 Essential (primary) hypertension: Secondary | ICD-10-CM

## 2022-01-06 DIAGNOSIS — E282 Polycystic ovarian syndrome: Secondary | ICD-10-CM

## 2022-01-06 MED ORDER — RYBELSUS 3 MG PO TABS: 3.0000 mg | ORAL_TABLET | Freq: Every day | ORAL | 0 refills | Status: DC

## 2022-01-06 MED ORDER — RYBELSUS 7 MG PO TABS: 7.0000 mg | ORAL_TABLET | Freq: Every day | ORAL | 0 refills | Status: DC

## 2022-01-07 ENCOUNTER — Encounter (HOSPITAL_BASED_OUTPATIENT_CLINIC_OR_DEPARTMENT_OTHER): Payer: Self-pay | Admitting: Obstetrics & Gynecology

## 2022-01-07 ENCOUNTER — Ambulatory Visit (HOSPITAL_BASED_OUTPATIENT_CLINIC_OR_DEPARTMENT_OTHER): Payer: BC Managed Care – PPO | Admitting: Obstetrics & Gynecology

## 2022-01-07 VITALS — BP 152/88 | HR 82 | Resp 16

## 2022-01-07 DIAGNOSIS — E282 Polycystic ovarian syndrome: Secondary | ICD-10-CM | POA: Diagnosis not present

## 2022-01-07 DIAGNOSIS — Z6841 Body Mass Index (BMI) 40.0 and over, adult: Secondary | ICD-10-CM | POA: Diagnosis not present

## 2022-01-07 DIAGNOSIS — I10 Essential (primary) hypertension: Secondary | ICD-10-CM

## 2022-01-07 MED ORDER — NORETHINDRONE 0.35 MG PO TABS: 1.0000 | ORAL_TABLET | Freq: Every day | ORAL | 1 refills | Status: DC

## 2022-01-07 NOTE — Progress Notes (Signed)
31 y.o. G0P0000 Married [place] or [place] American female here for new patient appt.  She was referred from Les Pou Early, NP.  Pt has been diagnosed with PCOS and has hx of irregular cycles.  She was diagnosed by provider at [facility].  I do not have any records but a release will be signed today.  Patient is currently on combination OCPs.  These were started due to history of irregular cycles.  She reports she could skip up to a few months at a time between cycles.  She does have hypertension and is on medication.  Blood pressure is elevated today.  She reports she has not taken her medication today.  Increased risk for stroke with combination OCPs and hypertension discussed.  Reviewed [place] medical eligibility criteria for contraceptive use with the patient which does not recommend combination OCPs with hypertension history even under good control.  We will need to switch to progesterone only pills. ? ?She is contemplating pregnancy in about a year.  Patient has been on metformin but did not tolerate due to GI distress.  She is going to switch to a new medication as her hemoglobin A1c was 6.3.  Although I do not have a fasting insulin level I am sure this is elevated as well.  I discussed with patient the importance of normalizing fasting insulin as this helps with ovulation function. ?        ?Sexually active: Yes.    ?The current method of family planning is OCP (estrogen/progesterone).    ? ? ?Health Maintenance: ?Pap:  02/2021 with neg HR HPV (in Epic in Media tab date 07/07/2021) ?History of abnormal Pap:  no ? ? reports that she has never smoked. She has never used smokeless tobacco. She reports that she does not drink alcohol and does not use drugs. ? ?Past Medical History:  ?Diagnosis Date  ? Anxiety   ? no meds  ? Cholelithiasis   ? Hypertension   ? Irritable bowel syndrome (IBS) 2018  ? PCOS (polycystic ovarian syndrome)   ? ? ?Past Surgical History:  ?Procedure Laterality Date  ? CHOLECYSTECTOMY  N/A 06/21/2017  ? Procedure: LAPAROSCOPIC CHOLECYSTECTOMY;  Surgeon: Ancil Linsey, MD;  Location: [facility] ORS;  Service: General;  Laterality: N/A;  ? NO PAST SURGERIES    ? ? ?Current Outpatient Medications  ?Medication Sig Dispense Refill  ? dicyclomine (BENTYL) 10 MG capsule Take 1 capsule (10 mg total) by mouth 3 times/day as needed-between meals & bedtime (intestinal spasms). 60 capsule 6  ? lisinopril-hydrochlorothiazide (ZESTORETIC) 10-12.5 MG tablet Take 1 tablet by mouth daily. 90 tablet 3  ? norethindrone (MICRONOR) 0.35 MG tablet Take 1 tablet (0.35 mg total) by mouth daily. 84 tablet 1  ? ondansetron (ZOFRAN) 4 MG tablet Take 1 tablet (4 mg total) by mouth every 8 (eight) hours as needed for nausea or vomiting. 30 tablet 3  ? Semaglutide (RYBELSUS) 3 MG TABS Take 3 mg by mouth daily. Start with this dose then increase to 7mg  after 30 days. (Patient not taking: Reported on 01/07/2022) 30 tablet 0  ? Semaglutide (RYBELSUS) 7 MG TABS Take 7 mg by mouth daily. Start this dose after completing 3mg  dose. (Patient not taking: Reported on 01/07/2022) 60 tablet 0  ? ?No current facility-administered medications for this visit.  ? ? ?Family History  ?Problem Relation Age of Onset  ? Hypertension Mother   ? Cancer Maternal Grandmother   ? Hypertension Maternal Grandmother   ? Colon cancer  Maternal Grandmother 74  ? Heart disease Maternal Grandfather   ? Hypertension Maternal Grandfather   ? ? ?Review of Systems  ?All other systems reviewed and are negative. ? ?Exam:   ?BP (!) 152/88   Pulse 82   Resp 16     ? ?General appearance: alert, cooperative and appears stated age ?No physical exam performed ? ?Assessment/Plan: ?1. PCOS (polycystic ovarian syndrome) ?- pt has not tolerated metformin.  New medication being starting.  Should plan repeat fasting insulin in 3 months ? ?2. BMI 40.0-44.9, adult (HCC) ?- weight loss prior to pregnancy high encouraged.  ?- should start PNV when considering stopping contraception ?-  would check  rubella then ?- pt will likely need ovulation induction agents  ? ?3. Primary hypertension ?- due to increased stoke risks, recommended stopping combination OCPs and transitioning to micronor.  Pt aware she may have irregular cycles with this and should call if this problematic.  ? ?

## 2022-01-09 ENCOUNTER — Encounter (HOSPITAL_BASED_OUTPATIENT_CLINIC_OR_DEPARTMENT_OTHER): Payer: Self-pay | Admitting: Obstetrics & Gynecology

## 2022-01-15 NOTE — Progress Notes (Signed)
Appt cancelled- erroneous encounter. ?

## 2022-01-17 ENCOUNTER — Ambulatory Visit (INDEPENDENT_AMBULATORY_CARE_PROVIDER_SITE_OTHER): Payer: BC Managed Care – PPO | Admitting: Nurse Practitioner

## 2022-01-17 ENCOUNTER — Encounter (HOSPITAL_BASED_OUTPATIENT_CLINIC_OR_DEPARTMENT_OTHER): Payer: Self-pay | Admitting: Nurse Practitioner

## 2022-01-17 DIAGNOSIS — I10 Essential (primary) hypertension: Secondary | ICD-10-CM

## 2022-01-17 DIAGNOSIS — R7303 Prediabetes: Secondary | ICD-10-CM

## 2022-01-17 DIAGNOSIS — Z6841 Body Mass Index (BMI) 40.0 and over, adult: Secondary | ICD-10-CM | POA: Diagnosis not present

## 2022-01-17 DIAGNOSIS — E282 Polycystic ovarian syndrome: Secondary | ICD-10-CM | POA: Diagnosis not present

## 2022-01-17 NOTE — Patient Instructions (Signed)
Continue rybelsus at current dose until complete then start the 7mg .  ?If you begin to have side effects, let me know immediately.  ? ?Be sure you are eating healthy snack options and avoid skipping meals to prevent blood sugar from dropping.  ? ?Work to walk at least 20-30 minutes a day to help with BG and weight.  ? ?If you decided to try for pregnancy or find out you are pregnant, stop the rybelsus immediately and let me know. We will need to change your BP medication and the rybelsus at that time.  ? ?I will plan to see you in 3 months so we can get labs.  ? ?Let me know if you have any questions.  ?
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
## 2022-01-17 NOTE — Assessment & Plan Note (Signed)
No weight change at this time, but just starting on Rybelsus in the last few weeks.  She is monitoring her diet and her appetite is decreased. Expect to see positive impact on weight in the near future.  ?Recommend healthy snack options and food choices that are positive to help control weight, HTN, and BG.  ?Recommend walking at least 20-30 minutes daily to help with BG control and weight.  ?Will plan to f/u in 3 months for labs.  ?

## 2022-01-17 NOTE — Assessment & Plan Note (Signed)
Doing great on Rybelsus starter dose. No SE noted. Not checking BG at home, but no symptoms. Appetite is down.  ?Plan to increase to 7 mg when completed the 3mg  dosing pack and we will follow-up in 3 months to determine if increased dose is needed.  ?No concerning Sx present today.  ?She will alert if SE or symptoms appear. She will stop the medication if she becomes pregnant or plans to conceive and we will determine alternative choices for control.  ?
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
## 2022-01-17 NOTE — Assessment & Plan Note (Signed)
Recent change in OCP to POP due to risk factors.  ?Doing well on current medication. Not planning pregnancy in the near future. ?Discussed if she decides to start planning to conceive or finds that she is pregnant to stop Rybelsus and OCP and let us know immediately. We will need to make changes to Rybelsus and BP medication to control chronic conditions.  ? ? ?

## 2022-02-21 ENCOUNTER — Encounter (HOSPITAL_BASED_OUTPATIENT_CLINIC_OR_DEPARTMENT_OTHER): Payer: Self-pay | Admitting: Obstetrics & Gynecology

## 2022-02-21 ENCOUNTER — Ambulatory Visit (HOSPITAL_BASED_OUTPATIENT_CLINIC_OR_DEPARTMENT_OTHER): Payer: BC Managed Care – PPO | Admitting: Obstetrics & Gynecology

## 2022-02-21 VITALS — BP 132/77 | HR 102 | Ht 64.0 in | Wt 248.4 lb

## 2022-02-21 DIAGNOSIS — R7303 Prediabetes: Secondary | ICD-10-CM | POA: Diagnosis not present

## 2022-02-21 DIAGNOSIS — Z3041 Encounter for surveillance of contraceptive pills: Secondary | ICD-10-CM | POA: Diagnosis not present

## 2022-02-21 DIAGNOSIS — E282 Polycystic ovarian syndrome: Secondary | ICD-10-CM | POA: Diagnosis not present

## 2022-02-21 MED ORDER — NORETHINDRONE 0.35 MG PO TABS: 1.0000 | ORAL_TABLET | Freq: Every day | ORAL | 3 refills | Status: DC

## 2022-02-21 NOTE — Progress Notes (Signed)
GYNECOLOGY  VISIT ? ?CC:   recheck ? ?HPI: ?31 y.o. [place] female here for recheck after starting micronor.  H/O PCOS.  Was on metformin but this was causing too much GI distress.  Now on Rebylsus which is daily dosing.  Appetite has been suppressed with this.  On micronor, she is having a daily menstrual cycle that lasts 5-6 days.  Flow is heavy for 3-4 days.  Then it tapers off.  This is typical for her.  Currently does not have any pregnancy plans.  Does have follow up with Jacolyn Reedy, PCP, in July.  Having lab work done then.  I would like her to have a fasting insulin level obtained at the same time.  Will send message regarding this.  Pt had lab appt with me in June for this.  Will cancel.  Pt has additional questions about connections between PCOS and diabetes.  These were all answered. ? ? ?Patient Active Problem List  ? Diagnosis Date Noted  ? Pre-diabetes 06/21/2021  ? PCOS (polycystic ovarian syndrome) 06/21/2021  ? Irritable bowel syndrome with both constipation and diarrhea 06/21/2021  ? Primary hypertension 03/09/2021  ? Encounter to establish care 02/23/2021  ? Physical exam 02/23/2021  ? BMI 45.0-49.9, adult ([facility]) 02/23/2021  ? Abnormal menses 02/23/2021  ? ? ?Past Medical History:  ?Diagnosis Date  ? Anxiety   ? no meds  ? Cholelithiasis   ? Hypertension   ? Irritable bowel syndrome (IBS) 2018  ? PCOS (polycystic ovarian syndrome)   ? ? ?Past Surgical History:  ?Procedure Laterality Date  ? CHOLECYSTECTOMY N/A 06/21/2017  ? Procedure: LAPAROSCOPIC CHOLECYSTECTOMY;  Surgeon: Vickie Epley, MD;  Location: [facility] ORS;  Service: General;  Laterality: N/A;  ? NO PAST SURGERIES    ? ? ?MEDS:   ?Current Outpatient Medications on File Prior to Visit  ?Medication Sig Dispense Refill  ? dicyclomine (BENTYL) 10 MG capsule Take 1 capsule (10 mg total) by mouth 3 times/day as needed-between meals & bedtime (intestinal spasms). 60 capsule 6  ? lisinopril-hydrochlorothiazide  (ZESTORETIC) 10-12.5 MG tablet Take 1 tablet by mouth daily. 90 tablet 3  ? norethindrone (MICRONOR) 0.35 MG tablet Take 1 tablet (0.35 mg total) by mouth daily. 84 tablet 1  ? ondansetron (ZOFRAN) 4 MG tablet Take 1 tablet (4 mg total) by mouth every 8 (eight) hours as needed for nausea or vomiting. 30 tablet 3  ? Semaglutide (RYBELSUS) 7 MG TABS Take 7 mg by mouth daily. Start this dose after completing 3mg  dose. 60 tablet 0  ? ?No current facility-administered medications on file prior to visit.  ? ? ?ALLERGIES: Patient has no known allergies. ? ?Family History  ?Problem Relation Age of Onset  ? Hypertension Mother   ? Cancer Maternal Grandmother   ? Hypertension Maternal Grandmother   ? Colon cancer Maternal Grandmother 30  ? Heart disease Maternal Grandfather   ? Hypertension Maternal Grandfather   ? ? ?SH:  married, non smoker ? ?Review of Systems  ?Constitutional: Negative.   ?Genitourinary: Negative.   ? ?PHYSICAL EXAMINATION:   ? ?BP 132/77 (BP Location: Left Arm, Patient Position: Sitting, Cuff Size: Large)   Pulse (!) 102   Ht 5\' 4"  (1.626 m) Comment: reported  Wt 248 lb 6.4 oz (112.7 kg)   LMP 02/21/2022   BMI 42.64 kg/m?     ?Physical Exam ?Constitutional:   ?   Appearance: Normal appearance.  ?Neurological:  ?   General: No  focal deficit present.  ?   Mental Status: She is alert.  ?Psychiatric:     ?   Mood and Affect: Mood normal.  ? ? ? ?Assessment/Plan: ?1. PCOS (polycystic ovarian syndrome) ?- not actively pursuing pregnancy at this time ?- recommended fasting insulin assessment and will plan this with PCP ? ?2. Pre-diabetes ?- on oral agent.  Has follow up scheduled in 3 weeks ? ?3. Encounter for surveillance of contraceptive pills ?- norethindrone (MICRONOR) 0.35 MG tablet; Take 1 tablet (0.35 mg total) by mouth daily.  Dispense: 84 tablet; Refill: 3 ?- Return for AEX 1 year.  Already has appt. ? ?Total time with pt today including documentation and chart reviewed: 25 minutes. ? ?

## 2022-03-23 ENCOUNTER — Ambulatory Visit (HOSPITAL_BASED_OUTPATIENT_CLINIC_OR_DEPARTMENT_OTHER): Payer: BC Managed Care – PPO | Admitting: Obstetrics & Gynecology

## 2022-03-23 ENCOUNTER — Other Ambulatory Visit (HOSPITAL_BASED_OUTPATIENT_CLINIC_OR_DEPARTMENT_OTHER): Payer: BC Managed Care – PPO

## 2022-04-20 ENCOUNTER — Encounter (HOSPITAL_BASED_OUTPATIENT_CLINIC_OR_DEPARTMENT_OTHER): Payer: Self-pay | Admitting: Nurse Practitioner

## 2022-04-20 ENCOUNTER — Ambulatory Visit (HOSPITAL_BASED_OUTPATIENT_CLINIC_OR_DEPARTMENT_OTHER): Payer: BC Managed Care – PPO | Admitting: Nurse Practitioner

## 2022-04-20 VITALS — BP 118/80 | HR 82 | Resp 12 | Ht 64.0 in | Wt 249.6 lb

## 2022-04-20 DIAGNOSIS — R7303 Prediabetes: Secondary | ICD-10-CM | POA: Diagnosis not present

## 2022-04-20 DIAGNOSIS — E282 Polycystic ovarian syndrome: Secondary | ICD-10-CM

## 2022-04-20 DIAGNOSIS — Z6841 Body Mass Index (BMI) 40.0 and over, adult: Secondary | ICD-10-CM

## 2022-04-20 DIAGNOSIS — I10 Essential (primary) hypertension: Secondary | ICD-10-CM | POA: Diagnosis not present

## 2022-04-20 DIAGNOSIS — E559 Vitamin D deficiency, unspecified: Secondary | ICD-10-CM

## 2022-04-20 DIAGNOSIS — K582 Mixed irritable bowel syndrome: Secondary | ICD-10-CM

## 2022-04-20 MED ORDER — RYBELSUS 7 MG PO TABS: 7.0000 mg | ORAL_TABLET | Freq: Every day | ORAL | 3 refills | Status: DC

## 2022-04-20 MED ORDER — LISINOPRIL-HYDROCHLOROTHIAZIDE 10-12.5 MG PO TABS: 1.0000 | ORAL_TABLET | Freq: Every day | ORAL | 3 refills | Status: DC

## 2022-04-20 NOTE — Progress Notes (Signed)
Shawna Clamp, DNP, AGNP-c [facility] [address] [place], [place] [address] [phone] Office [phone] Fax  ESTABLISHED PATIENT- Chronic Health and/or Follow-Up Visit  Blood pressure 118/80, pulse 82, resp. rate 12, height 5\' 4"  (1.626 m), weight 249 lb 9.6 oz (113.2 kg).  No chief complaint on file.   HPI  Briana Barber  is a 31 y.o. year old female presenting today for evaluation and management of the following: HTN Blood pressures have been very well controlled.  She has recently changed jobs and is now currently working in the [employer].  Due to the job changes she feels her stress levels have improved significantly which she feels have had a positive impact on her blood pressure.  She is taking her medication as prescribed without any missed doses.  She denies any shortness of breath, chest pain, palpitations, headaches, lower extremity edema. Pre-DM/BMI/PCOS She has been working on improved dietary choices.  She was working out more regularly previously but does plan to increase her physical activity again.  She has been utilizing Rybelsus for blood sugar control and she is tolerating this medication well with no significant side effects.  She would like to continue on this medication.  ROS All ROS negative with exception of what is listed in HPI  PHYSICAL EXAM Physical Exam Vitals and nursing note reviewed.  Constitutional:      General: She is not in acute distress.    Appearance: Normal appearance.  HENT:     Head: Normocephalic.  Eyes:     Extraocular Movements: Extraocular movements intact.     Conjunctiva/sclera: Conjunctivae normal.     Pupils: Pupils are equal, round, and reactive to light.  Neck:     Vascular: No carotid bruit.  Cardiovascular:     Rate and Rhythm: Normal rate and regular rhythm.     Pulses: Normal pulses.     Heart sounds: Normal heart sounds. No murmur heard. Pulmonary:      Effort: Pulmonary effort is normal.     Breath sounds: Normal breath sounds. No wheezing.  Abdominal:     General: Bowel sounds are normal. There is no distension.     Palpations: Abdomen is soft.     Tenderness: There is no abdominal tenderness. There is no guarding.  Musculoskeletal:        General: Normal range of motion.     Cervical back: Normal range of motion and neck supple.     Right lower leg: No edema.     Left lower leg: No edema.  Lymphadenopathy:     Cervical: No cervical adenopathy.  Skin:    General: Skin is warm and dry.     Capillary Refill: Capillary refill takes less than 2 seconds.  Neurological:     General: No focal deficit present.     Mental Status: She is alert and oriented to person, place, and time.  Psychiatric:        Mood and Affect: Mood normal.        Behavior: Behavior normal.        Thought Content: Thought content normal.        Judgment: Judgment normal.     ASSESSMENT & PLAN Problem List Items Addressed This Visit     BMI 45.0-49.9, adult (HCC)    Working on diet and exercise modifications as well as utilization of Rybelsus for blood sugar control.  We will obtain labs and repeat evaluation.  No  alarm symptoms are present at this time.  Recommend continuation of lifestyle changes and medication.  Encouraged increased exercising to help facilitate improved weight loss.      Relevant Medications   Semaglutide (RYBELSUS) 7 MG TABS   Other Relevant Orders   CBC with Differential/Platelet (Completed)   Comprehensive metabolic panel (Completed)   Hemoglobin A1c (Completed)   Lipid panel (Completed)   VITAMIN D 25 Hydroxy (Vit-D Deficiency, Fractures) (Completed)   TSH (Completed)   Primary hypertension    Chronic.  Blood pressure well controlled on current medication regimen.  She is not having any side effects from the medication.  No alarm symptoms are present at this time.  Will obtain labs today for evaluation of kidney function and  electrolytes.  Decreased drafts levels have likely improved her control.  Recommend increase physical activity starting with walking approximately 10 minutes a day and continue with dietary modifications.  We will plan routine follow-up.      Relevant Medications   Semaglutide (RYBELSUS) 7 MG TABS   lisinopril-hydrochlorothiazide (ZESTORETIC) 10-12.5 MG tablet   Other Relevant Orders   CBC with Differential/Platelet (Completed)   Comprehensive metabolic panel (Completed)   Hemoglobin A1c (Completed)   Lipid panel (Completed)   VITAMIN D 25 Hydroxy (Vit-D Deficiency, Fractures) (Completed)   TSH (Completed)   Pre-diabetes - Primary    Working on improved control with diet, exercise, and the use of Rybelsus.  She is tolerating the medication well with no side effects.  We will plan to continue current medication management and lifestyle changes.  If any new or worsening symptoms present patient will follow-up immediately.      Relevant Medications   Semaglutide (RYBELSUS) 7 MG TABS   Other Relevant Orders   CBC with Differential/Platelet (Completed)   Comprehensive metabolic panel (Completed)   Hemoglobin A1c (Completed)   Lipid panel (Completed)   VITAMIN D 25 Hydroxy (Vit-D Deficiency, Fractures) (Completed)   TSH (Completed)   PCOS (polycystic ovarian syndrome)    Last month menstrual cycle was late, but still waiting for her menstrual cycle this month.  She has not had any complete missed months recently.      Relevant Medications   Semaglutide (RYBELSUS) 7 MG TABS   Other Relevant Orders   CBC with Differential/Platelet (Completed)   Comprehensive metabolic panel (Completed)   Hemoglobin A1c (Completed)   Lipid panel (Completed)   VITAMIN D 25 Hydroxy (Vit-D Deficiency, Fractures) (Completed)   TSH (Completed)   Irritable bowel syndrome with both constipation and diarrhea    Significant improvement in symptoms of IBS since her job change.  Recommend continued monitoring for  any new or worsening symptoms.  No alarm symptoms present at this time.      Relevant Orders   CBC with Differential/Platelet (Completed)   Comprehensive metabolic panel (Completed)   Hemoglobin A1c (Completed)   Lipid panel (Completed)   VITAMIN D 25 Hydroxy (Vit-D Deficiency, Fractures) (Completed)   TSH (Completed)   Other Visit Diagnoses     Vitamin D deficiency       Relevant Medications   Vitamin D, Ergocalciferol, (DRISDOL) 1.25 MG (50000 UNIT) CAPS capsule        FOLLOW-UP Return in about 6 months (around 10/21/2022) for HTN, Pre-DM.   Shawna Clamp, DNP, AGNP-c 04/20/2022  8:39 AM

## 2022-04-20 NOTE — Patient Instructions (Signed)
It was a pleasure seeing you today. I hope your time spent with [place] was pleasant and helpful. Please let us know if there is anything we can do to improve the service you receive.   You are looking great today!! I am very proud of how well you are doing.  I will be in touch with you about your labs once I have reviewed them. Please let me know if you have any questions or concerns,   I sent refills of the Rybelsus and your blood pressure medication to the pharmacy for you.   Important Office Information Lab Results If labs were ordered, please note that you will see results through MyChart as soon as they come available from [facility].  It takes up to 5 business days for the results to be routed to me and for me to review them once all of the lab results have come through from [facility]. I will make recommendations based on your results and send these through MyChart or someone from the office will call you to discuss. If your labs are abnormal, we may contact you to schedule a visit to discuss the results and make recommendations.  If you have not heard from [place] within 5 business days or you have waited longer than a week and your lab results have not come through on MyChart, please feel free to call the office or send a message through MyChart to follow-up on these labs.   Referrals If referrals were placed today, the office where the referral was sent will contact you either by phone or through MyChart to set up scheduling. Please note that it can take up to a week for the referral office to contact you. If you do not hear from them in a week, please contact the referral office directly to inquire about scheduling.   Condition Treated If your condition worsens or you begin to have new symptoms, please schedule a follow-up appointment for further evaluation. If you are not sure if an appointment is needed, you may call the office to leave a message for the nurse and someone will contact you with  recommendations.  If you have an urgent or life threatening emergency, please do not call the office, but seek emergency evaluation by calling 911 or going to the nearest emergency room for evaluation.   MyChart and Phone Calls Please do not use MyChart for urgent messages. It may take up to 3 business days for MyChart messages to be read by staff and if they are unable to handle the request, an additional 3 business days for them to be routed to me and for my response.  Messages sent to the provider through MyChart do not come directly to the provider, please allow time for these messages to be routed and for me to respond.  We get a large volume of MyChart messages daily and these are responded to in the order received.   For urgent messages, please call the office at [phone] and speak with the front office staff or leave a message on the line of my assistant for guidance.  We are seeing patients from the hours of 8:00 am through 5:00 pm and calls directly to the nurse may not be answered immediately due to seeing patients, but your call will be returned as soon as possible.  Phone  messages received after 4:00 PM Monday through Thursday may not be returned until the following business day. Phone messages received after 11:00 AM on Friday may  not be returned until Monday.   After Hours We share on call hours with providers from other offices. If you have an urgent need after hours that cannot wait until the next business day, please contact the on call provider by calling the office number. A nurse will speak with you and contact the provider if needed for recommendations.  If you have an urgent or life threatening emergency after hours, please do not call the on call provider, but seek emergency evaluation by calling 911 or going to the nearest emergency room for evaluation.   Paperwork All paperwork requires a minimum of 5 days to complete and return to you or the designated personnel.  Please keep this in mind when bringing in forms or sending requests for paperwork completion to the office.

## 2022-04-20 NOTE — Assessment & Plan Note (Addendum)
Last month menstrual cycle was late, but still waiting for her menstrual cycle this month.  She has not had any complete missed months recently.

## 2022-04-20 NOTE — Assessment & Plan Note (Addendum)
Significant improvement in symptoms of IBS since her job change.  Recommend continued monitoring for any new or worsening symptoms.  No alarm symptoms present at this time.

## 2022-04-21 LAB — COMPREHENSIVE METABOLIC PANEL
ALT: 22 IU/L (ref 0–32)
AST: 15 IU/L (ref 0–40)
Albumin/Globulin Ratio: 1.4 (ref 1.2–2.2)
Albumin: 4.4 g/dL (ref 3.9–4.9)
Alkaline Phosphatase: 66 IU/L (ref 44–121)
BUN/Creatinine Ratio: 15 (ref 9–23)
BUN: 14 mg/dL (ref 6–20)
Bilirubin Total: 0.3 mg/dL (ref 0.0–1.2)
CO2: 23 mmol/L (ref 20–29)
Calcium: 9.4 mg/dL (ref 8.7–10.2)
Chloride: 100 mmol/L (ref 96–106)
Creatinine, Ser: 0.92 mg/dL (ref 0.57–1.00)
Globulin, Total: 3.2 g/dL (ref 1.5–4.5)
Glucose: 107 mg/dL — ABNORMAL HIGH (ref 70–99)
Potassium: 4.5 mmol/L (ref 3.5–5.2)
Sodium: 137 mmol/L (ref 134–144)
Total Protein: 7.6 g/dL (ref 6.0–8.5)
eGFR: 85 mL/min/{1.73_m2} (ref >59)

## 2022-04-21 LAB — TSH: TSH: 1.67 u[IU]/mL (ref 0.450–4.500)

## 2022-04-21 LAB — LIPID PANEL
Chol/HDL Ratio: 5.5 ratio — ABNORMAL HIGH (ref 0.0–4.4)
Cholesterol, Total: 208 mg/dL — ABNORMAL HIGH (ref 100–199)
HDL: 38 mg/dL — ABNORMAL LOW (ref >39)
LDL Chol Calc (NIH): 160 mg/dL — ABNORMAL HIGH (ref 0–99)
Triglycerides: 57 mg/dL (ref 0–149)
VLDL Cholesterol Cal: 10 mg/dL (ref 5–40)

## 2022-04-21 LAB — CBC WITH DIFFERENTIAL/PLATELET
Basophils Absolute: 0.1 10*3/uL (ref 0.0–0.2)
Basos: 1 %
EOS (ABSOLUTE): 0.2 10*3/uL (ref 0.0–0.4)
Eos: 2 %
Hematocrit: 39.7 % (ref 34.0–46.6)
Hemoglobin: 13.3 g/dL (ref 11.1–15.9)
Immature Grans (Abs): 0 10*3/uL (ref 0.0–0.1)
Immature Granulocytes: 0 %
Lymphocytes Absolute: 2.2 10*3/uL (ref 0.7–3.1)
Lymphs: 27 %
MCH: 29.7 pg (ref 26.6–33.0)
MCHC: 33.5 g/dL (ref 31.5–35.7)
MCV: 89 fL (ref 79–97)
Monocytes Absolute: 0.4 10*3/uL (ref 0.1–0.9)
Monocytes: 5 %
Neutrophils Absolute: 5.4 10*3/uL (ref 1.4–7.0)
Neutrophils: 65 %
Platelets: 434 10*3/uL (ref 150–450)
RBC: 4.48 x10E6/uL (ref 3.77–5.28)
RDW: 13.2 % (ref 11.7–15.4)
WBC: 8.2 10*3/uL (ref 3.4–10.8)

## 2022-04-21 LAB — HEMOGLOBIN A1C
Est. average glucose Bld gHb Est-mCnc: 126 mg/dL
Hgb A1c MFr Bld: 6 % — ABNORMAL HIGH (ref 4.8–5.6)

## 2022-04-21 LAB — VITAMIN D 25 HYDROXY (VIT D DEFICIENCY, FRACTURES): Vit D, 25-Hydroxy: 12.1 ng/mL — ABNORMAL LOW (ref 30.0–100.0)

## 2022-04-25 MED ORDER — VITAMIN D (ERGOCALCIFEROL) 1.25 MG (50000 UNIT) PO CAPS: 50000.0000 [IU] | ORAL_CAPSULE | ORAL | 0 refills | Status: DC

## 2022-05-29 NOTE — Assessment & Plan Note (Signed)
Working on improved control with diet, exercise, and the use of Rybelsus.  She is tolerating the medication well with no side effects.  We will plan to continue current medication management and lifestyle changes.  If any new or worsening symptoms present patient will follow-up immediately.

## 2022-05-29 NOTE — Assessment & Plan Note (Signed)
Working on diet and exercise modifications as well as utilization of Rybelsus for blood sugar control.  We will obtain labs and repeat evaluation.  No alarm symptoms are present at this time.  Recommend continuation of lifestyle changes and medication.  Encouraged increased exercising to help facilitate improved weight loss.

## 2022-05-29 NOTE — Assessment & Plan Note (Signed)
Chronic.  Blood pressure well controlled on current medication regimen.  She is not having any side effects from the medication.  No alarm symptoms are present at this time.  Will obtain labs today for evaluation of kidney function and electrolytes.  Decreased drafts levels have likely improved her control.  Recommend increase physical activity starting with walking approximately 10 minutes a day and continue with dietary modifications.  We will plan routine follow-up.

## 2022-07-17 ENCOUNTER — Other Ambulatory Visit (HOSPITAL_BASED_OUTPATIENT_CLINIC_OR_DEPARTMENT_OTHER): Payer: Self-pay | Admitting: Nurse Practitioner

## 2022-07-17 DIAGNOSIS — E559 Vitamin D deficiency, unspecified: Secondary | ICD-10-CM

## 2022-10-08 ENCOUNTER — Other Ambulatory Visit (HOSPITAL_BASED_OUTPATIENT_CLINIC_OR_DEPARTMENT_OTHER): Payer: Self-pay | Admitting: Nurse Practitioner

## 2022-10-08 DIAGNOSIS — E559 Vitamin D deficiency, unspecified: Secondary | ICD-10-CM

## 2022-10-21 ENCOUNTER — Ambulatory Visit: Payer: BC Managed Care – PPO | Admitting: Nurse Practitioner

## 2022-10-21 ENCOUNTER — Ambulatory Visit (HOSPITAL_BASED_OUTPATIENT_CLINIC_OR_DEPARTMENT_OTHER): Payer: BC Managed Care – PPO | Admitting: Nurse Practitioner

## 2022-10-21 ENCOUNTER — Encounter: Payer: Self-pay | Admitting: Nurse Practitioner

## 2022-10-21 VITALS — BP 126/82 | HR 76 | Temp 98.3°F | Ht 64.0 in | Wt 258.6 lb

## 2022-10-21 DIAGNOSIS — Z79899 Other long term (current) drug therapy: Secondary | ICD-10-CM

## 2022-10-21 DIAGNOSIS — Z6841 Body Mass Index (BMI) 40.0 and over, adult: Secondary | ICD-10-CM

## 2022-10-21 DIAGNOSIS — R7303 Prediabetes: Secondary | ICD-10-CM

## 2022-10-21 DIAGNOSIS — E282 Polycystic ovarian syndrome: Secondary | ICD-10-CM

## 2022-10-21 DIAGNOSIS — I10 Essential (primary) hypertension: Secondary | ICD-10-CM

## 2022-10-21 DIAGNOSIS — K582 Mixed irritable bowel syndrome: Secondary | ICD-10-CM

## 2022-10-21 DIAGNOSIS — E559 Vitamin D deficiency, unspecified: Secondary | ICD-10-CM

## 2022-10-21 DIAGNOSIS — E782 Mixed hyperlipidemia: Secondary | ICD-10-CM

## 2022-10-21 MED ORDER — SEMAGLUTIDE 14 MG PO TABS: 14.0000 mg | ORAL_TABLET | Freq: Every day | ORAL | 3 refills | Status: DC

## 2022-10-21 MED ORDER — LISINOPRIL-HYDROCHLOROTHIAZIDE 10-12.5 MG PO TABS: 1.0000 | ORAL_TABLET | Freq: Every day | ORAL | 3 refills | Status: DC

## 2022-10-21 NOTE — Assessment & Plan Note (Signed)
Improvement of symptoms with no use of bentyl in recent months. She does have medication at home in the event she needs it. No refills provided today, but she will let me know if she has increased symptoms and needs refills. Labs pending today. Follow-up as needed.

## 2022-10-21 NOTE — Progress Notes (Signed)
Worthy Keeler, DNP, AGNP-c [facility]  [address] [place],  [id number] [place] and/or Follow-Up Visit  Blood pressure 126/82, pulse 76, temperature 98.3 F (36.8 C), height 5\' 4"  (1.626 m), weight 258 lb 9.6 oz (117.3 kg), last menstrual period 09/23/2022.    Briana Barber is a 32 y.o. year old female presenting today for evaluation and management of the following: Prediabetes/HTN/PCOS She tells me that she is not having any headaches, vision changes, shortness of breath, CP, paresthesias. She is tolerating the rybelsus well with no reported side effects. She has not noticed any weight changes with the medication, but she feels like her appetite is lower. She is not having any nausea.  She is having regular periods with use of the micronor and no side effects.   IBS Doing very well. Has not been having the GI issues that she had a couple of years ago. She is no longer using the Bentyl, but does have some at home in the event it is needed.   All ROS negative with exception of what is listed above.   PHYSICAL EXAM Physical Exam Vitals and nursing note reviewed.  Constitutional:      General: She is not in acute distress.    Appearance: Normal appearance.  HENT:     Head: Normocephalic.  Eyes:     Extraocular Movements: Extraocular movements intact.     Conjunctiva/sclera: Conjunctivae normal.     Pupils: Pupils are equal, round, and reactive to light.  Neck:     Vascular: No carotid bruit.  Cardiovascular:     Rate and Rhythm: Normal rate and regular rhythm.     Pulses: Normal pulses.     Heart sounds: Normal heart sounds. No murmur heard. Pulmonary:     Effort: Pulmonary effort is normal.     Breath sounds: Normal breath sounds. No wheezing.  Abdominal:     General: Bowel sounds are normal. There is no distension.     Palpations: Abdomen is soft.     Tenderness: There is no abdominal tenderness. There  is no guarding.  Musculoskeletal:        General: Normal range of motion.     Cervical back: Normal range of motion and neck supple.     Right lower leg: No edema.     Left lower leg: No edema.  Lymphadenopathy:     Cervical: No cervical adenopathy.  Skin:    General: Skin is warm and dry.     Capillary Refill: Capillary refill takes less than 2 seconds.  Neurological:     General: No focal deficit present.     Mental Status: She is alert and oriented to person, place, and time.  Psychiatric:        Mood and Affect: Mood normal.        Behavior: Behavior normal.        Thought Content: Thought content normal.        Judgment: Judgment normal.     PLAN Problem List Items Addressed This Visit     BMI 45.0-49.9, adult ([place])    BMI fairly stable at this time. We will monitor labs today. Recommend increased physical activity with purposeful walking or using YouTube to find beginner workout that can be done in short sessions at home. Continue with Rybelsus. May consider transitioning to Ozempic or Mounjaro in the future to see if this is more helpful with blood sugar and weight management.  Relevant Medications   Semaglutide 14 MG TABS   Other Relevant Orders   CBC with Differential/Platelet (Completed)   Comprehensive metabolic panel (Completed)   Hemoglobin A1c (Completed)   Lipid panel (Completed)   TSH (Completed)   Pre-diabetes    Chronic. Doing well on current regimen. No alarm symptoms. Labs pending today. No changes at this time. Given her difficulty with weight management and co-morbidities, we may want to consider transitioning to injectable GLP-1 medication to see if this is more helpful.       Relevant Medications   Semaglutide 14 MG TABS   Other Relevant Orders   CBC with Differential/Platelet (Completed)   Comprehensive metabolic panel (Completed)   Hemoglobin A1c (Completed)   Lipid panel (Completed)   PCOS (polycystic ovarian syndrome)    Chronic. Her  periods are regular with the medication and her blood sugars are currently well controlled. We will monitor labs today.       Relevant Medications   Semaglutide 14 MG TABS   Other Relevant Orders   TSH (Completed)   Primary hypertension - Primary    Chronic. No alarm symptoms present at this time. She is tolerating her medication well and taking as scheduled. We will obtain labs today for evaluation. Refills provided today. Will plan to follow-up in 6 months or sooner if needed.       Relevant Medications   lisinopril-hydrochlorothiazide (ZESTORETIC) 10-12.5 MG tablet   Semaglutide 14 MG TABS   Other Relevant Orders   CBC with Differential/Platelet (Completed)   Comprehensive metabolic panel (Completed)   Hemoglobin A1c (Completed)   Lipid panel (Completed)   Irritable bowel syndrome with both constipation and diarrhea    Improvement of symptoms with no use of bentyl in recent months. She does have medication at home in the event she needs it. No refills provided today, but she will let me know if she has increased symptoms and needs refills. Labs pending today. Follow-up as needed.       Other Visit Diagnoses     Vitamin D deficiency       Relevant Orders   VITAMIN D 25 Hydroxy (Vit-D Deficiency, Fractures) (Completed)   Mixed hyperlipidemia       Relevant Medications   lisinopril-hydrochlorothiazide (ZESTORETIC) 10-12.5 MG tablet   Semaglutide 14 MG TABS   Other Relevant Orders   CBC with Differential/Platelet (Completed)   Comprehensive metabolic panel (Completed)   Hemoglobin A1c (Completed)   Lipid panel (Completed)   High risk medication use       Relevant Medications   Semaglutide 14 MG TABS   Other Relevant Orders   TSH (Completed)       Return in about 6 months (around 04/21/2023) for Chronic.   Worthy Keeler, DNP, AGNP-c 10/21/2022  8:48 AM

## 2022-10-21 NOTE — Assessment & Plan Note (Addendum)
Chronic. Her periods are regular with the medication and her blood sugars are currently well controlled. We will monitor labs today.

## 2022-10-21 NOTE — Assessment & Plan Note (Signed)
Chronic. No alarm symptoms present at this time. She is tolerating her medication well and taking as scheduled. We will obtain labs today for evaluation. Refills provided today. Will plan to follow-up in 6 months or sooner if needed.

## 2022-10-21 NOTE — Patient Instructions (Addendum)
Good luck in school! I am so proud of you. Please let me know if you need me.   I have increased your rybelsus to 14mg  a day. This should help with improved appetite suppression and weight management.   WEIGHT LOSS PLANNING Your progress today shows:     10/21/2022    8:45 AM 04/20/2022    8:38 AM 02/21/2022    8:36 AM  Vitals with BMI  Height 5\' 4"  5\' 4"  5\' 4"   Weight 258 lbs 10 oz 249 lbs 10 oz 248 lbs 6 oz  BMI 44.37 40.98 11.91  Systolic 478 295 621  Diastolic 82 80 77  Pulse 76 82 102    For best management of weight, it is vital to balance intake versus output. This means the number of calories burned per day must be less than the calories you take in with food and drink.   I recommend trying to follow a diet with the following: Calories: 1200-1500 calories per day Carbohydrates: 150-180 grams of carbohydrates per day  Why: Gives your body enough "quick fuel" for cells to maintain normal function without sending them into starvation mode.  Protein: At least 45-55 grams of protein per day Why: Protein takes longer and uses more energy than carbohydrates to break down for fuel. The carbohydrates in your meals serves as quick energy sources and proteins help use some of that extra quick energy to break down to produce long term energy. This helps you not feel hungry as quickly and protein breakdown burns calories.  Water: Drink AT LEAST 64 ounces of water per day  Why: Water is essential to healthy metabolism. Water helps to fill the stomach and keep you fuller longer. Water is required for healthy digestion and filtering of waste in the body.  Fat: Limit fats in your diet- when choosing fats, choose foods with lower fats content such as lean meats (chicken, fish, [place]).  Why: Increased fat intake leads to storage "for later". Once you burn your carbohydrate energy, your body goes into fat and protein breakdown mode to help you loose weight.  Cholesterol: Fats and oils that are  LIQUID at room temperature are best. Choose vegetable oils (olive oil, avocado oil, nuts). Avoid fats that are SOLID at room temperature (animal fats, processed meats). Healthy fats are often found in whole grains, beans, nuts, seeds, and berries.  Why: Elevated cholesterol levels lead to build up of cholesterol on the inside of your blood vessels. This will eventually cause the blood vessels to become hard and can lead to high blood pressure and damage to your organs. When the blood flow is reduced, but the pressure is high from cholesterol buildup, parts of the cholesterol can break off and form clots that can go to the brain or heart leading to a stroke or heart attack.  Fiber: Increase amount of SOLUBLE the fiber in your diet. This helps to fill you up, lowers cholesterol, and helps with digestion. Some foods high in soluble fiber are oats, peas, beans, apples, carrots, barley, and citrus fruits.   Why: Fiber fills you up, helps remove excess cholesterol, and aids in healthy digestion which are all very important in weight management.   I recommend the following as a minimum activity routine: Purposeful walk or other physical activity at least 20 minutes every single day. This means purposefully taking a walk, jog, bike, swim, treadmill, elliptical, dance, etc.  This activity should be ABOVE your normal daily activities, such as walking at  work. Goal exercise should be at least 150 minutes a week- work your way up to this.   Heart Rate: Your maximum exercise heart rate should be 220 - Your Age in Years. When exercising, get your heart rate up, but avoid going over the maximum targeted heart rate.  60-70% of your maximum heart rate is where you tend to burn the most fat. To find this number:  220 - Age In Years= Max HR  Max HR x 0.6 (or 0.7) = Fat Burning HR The Fat Burning HR is your goal heart rate while working out to burn the most fat.  NEVER exercise to the point your feel lightheaded,  weak, nauseated, dizzy. If you experience ANY of these symptoms- STOP exercise! Allow yourself to cool down and your heart rate to come down. Then restart slower next time.  If at [place] you feel chest pain or chest pressure during exercise, STOP IMMEDIATELY and seek medical attention.

## 2022-10-22 LAB — CBC WITH DIFFERENTIAL/PLATELET
Basophils Absolute: 0 10*3/uL (ref 0.0–0.2)
Basos: 1 %
EOS (ABSOLUTE): 0.1 10*3/uL (ref 0.0–0.4)
Eos: 1 %
Hematocrit: 38.4 % (ref 34.0–46.6)
Hemoglobin: 12.7 g/dL (ref 11.1–15.9)
Immature Grans (Abs): 0 10*3/uL (ref 0.0–0.1)
Immature Granulocytes: 0 %
Lymphocytes Absolute: 2.7 10*3/uL (ref 0.7–3.1)
Lymphs: 33 %
MCH: 29.4 pg (ref 26.6–33.0)
MCHC: 33.1 g/dL (ref 31.5–35.7)
MCV: 89 fL (ref 79–97)
Monocytes Absolute: 0.4 10*3/uL (ref 0.1–0.9)
Monocytes: 5 %
Neutrophils Absolute: 5 10*3/uL (ref 1.4–7.0)
Neutrophils: 60 %
Platelets: 387 10*3/uL (ref 150–450)
RBC: 4.32 x10E6/uL (ref 3.77–5.28)
RDW: 13 % (ref 11.7–15.4)
WBC: 8.2 10*3/uL (ref 3.4–10.8)

## 2022-10-22 LAB — LIPID PANEL
Chol/HDL Ratio: 5.1 ratio — ABNORMAL HIGH (ref 0.0–4.4)
Cholesterol, Total: 192 mg/dL (ref 100–199)
HDL: 38 mg/dL — ABNORMAL LOW (ref >39)
LDL Chol Calc (NIH): 144 mg/dL — ABNORMAL HIGH (ref 0–99)
Triglycerides: 52 mg/dL (ref 0–149)
VLDL Cholesterol Cal: 10 mg/dL (ref 5–40)

## 2022-10-22 LAB — COMPREHENSIVE METABOLIC PANEL
ALT: 17 IU/L (ref 0–32)
AST: 10 IU/L (ref 0–40)
Albumin/Globulin Ratio: 1.4 (ref 1.2–2.2)
Albumin: 4.1 g/dL (ref 3.9–4.9)
Alkaline Phosphatase: 58 IU/L (ref 44–121)
BUN/Creatinine Ratio: 14 (ref 9–23)
BUN: 13 mg/dL (ref 6–20)
Bilirubin Total: 0.3 mg/dL (ref 0.0–1.2)
CO2: 21 mmol/L (ref 20–29)
Calcium: 9.4 mg/dL (ref 8.7–10.2)
Chloride: 104 mmol/L (ref 96–106)
Creatinine, Ser: 0.9 mg/dL (ref 0.57–1.00)
Globulin, Total: 3 g/dL (ref 1.5–4.5)
Glucose: 98 mg/dL (ref 70–99)
Potassium: 4.3 mmol/L (ref 3.5–5.2)
Sodium: 140 mmol/L (ref 134–144)
Total Protein: 7.1 g/dL (ref 6.0–8.5)
eGFR: 87 mL/min/{1.73_m2} (ref >59)

## 2022-10-22 LAB — VITAMIN D 25 HYDROXY (VIT D DEFICIENCY, FRACTURES): Vit D, 25-Hydroxy: 42.6 ng/mL (ref 30.0–100.0)

## 2022-10-22 LAB — HEMOGLOBIN A1C
Est. average glucose Bld gHb Est-mCnc: 128 mg/dL
Hgb A1c MFr Bld: 6.1 % — ABNORMAL HIGH (ref 4.8–5.6)

## 2022-10-22 LAB — TSH: TSH: 2 u[IU]/mL (ref 0.450–4.500)

## 2022-10-25 ENCOUNTER — Encounter: Payer: Self-pay | Admitting: Nurse Practitioner

## 2022-10-25 DIAGNOSIS — E1169 Type 2 diabetes mellitus with other specified complication: Secondary | ICD-10-CM

## 2022-10-26 MED ORDER — ROSUVASTATIN CALCIUM 10 MG PO TABS: ORAL_TABLET | ORAL | 3 refills | Status: DC

## 2022-10-29 ENCOUNTER — Telehealth: Payer: Self-pay | Admitting: Nurse Practitioner

## 2022-10-29 DIAGNOSIS — E1165 Type 2 diabetes mellitus with hyperglycemia: Secondary | ICD-10-CM

## 2022-10-29 NOTE — Telephone Encounter (Signed)
P.A. RYBELSUS  

## 2022-10-29 NOTE — Assessment & Plan Note (Signed)
BMI fairly stable at this time. We will monitor labs today. Recommend increased physical activity with purposeful walking or using YouTube to find beginner workout that can be done in short sessions at home. Continue with Rybelsus. May consider transitioning to Ozempic or Mounjaro in the future to see if this is more helpful with blood sugar and weight management.

## 2022-10-29 NOTE — Assessment & Plan Note (Signed)
Chronic. Doing well on current regimen. No alarm symptoms. Labs pending today. No changes at this time. Given her difficulty with weight management and co-morbidities, we may want to consider transitioning to injectable GLP-1 medication to see if this is more helpful.

## 2022-11-10 NOTE — Telephone Encounter (Signed)
P.A. denied, only covered for Type 2 diabetes, do you want to switch to weight loss medication, I don't know if her insurance will cover for [facility], or other?

## 2022-11-10 NOTE — Telephone Encounter (Signed)
Please call Debar and let her know that her PA for Rybelsus was denied. I am going to see if we can get [facility] covered for her.

## 2022-11-15 ENCOUNTER — Other Ambulatory Visit: Payer: Self-pay | Admitting: Nurse Practitioner

## 2022-11-15 DIAGNOSIS — Z6841 Body Mass Index (BMI) 40.0 and over, adult: Secondary | ICD-10-CM

## 2022-11-15 DIAGNOSIS — I10 Essential (primary) hypertension: Secondary | ICD-10-CM

## 2022-11-15 DIAGNOSIS — E282 Polycystic ovarian syndrome: Secondary | ICD-10-CM

## 2022-11-15 DIAGNOSIS — E1165 Type 2 diabetes mellitus with hyperglycemia: Secondary | ICD-10-CM

## 2022-11-15 MED ORDER — TIRZEPATIDE 2.5 MG/0.5ML ~~LOC~~ SOAJ: 2.5000 mg | SUBCUTANEOUS | 0 refills | Status: DC

## 2022-11-27 ENCOUNTER — Telehealth: Payer: Self-pay | Admitting: Nurse Practitioner

## 2022-11-27 DIAGNOSIS — E1165 Type 2 diabetes mellitus with hyperglycemia: Secondary | ICD-10-CM

## 2022-11-27 NOTE — Telephone Encounter (Signed)
P.A. MOUNJARO 

## 2022-11-30 DIAGNOSIS — E1165 Type 2 diabetes mellitus with hyperglycemia: Secondary | ICD-10-CM | POA: Insufficient documentation

## 2022-11-30 NOTE — Telephone Encounter (Signed)
P.A. Darcel Bayley also denied, not covered unless pt has Type 2 Diabetes

## 2022-11-30 NOTE — Telephone Encounter (Signed)
She has diagnosis of DM2- I am so sorry, it looks like the diagnosis is in her "previous" diagnoses but not her problem list. I can try to run it through. She has tried and failed metformin and rybelsus.

## 2023-01-19 ENCOUNTER — Encounter (HOSPITAL_BASED_OUTPATIENT_CLINIC_OR_DEPARTMENT_OTHER): Payer: Self-pay | Admitting: Obstetrics & Gynecology

## 2023-01-19 ENCOUNTER — Ambulatory Visit (INDEPENDENT_AMBULATORY_CARE_PROVIDER_SITE_OTHER): Payer: BC Managed Care – PPO | Admitting: Obstetrics & Gynecology

## 2023-01-19 VITALS — BP 130/86 | HR 93 | Ht 64.0 in | Wt 267.4 lb

## 2023-01-19 DIAGNOSIS — E1165 Type 2 diabetes mellitus with hyperglycemia: Secondary | ICD-10-CM | POA: Diagnosis not present

## 2023-01-19 DIAGNOSIS — E282 Polycystic ovarian syndrome: Secondary | ICD-10-CM

## 2023-01-19 DIAGNOSIS — Z6841 Body Mass Index (BMI) 40.0 and over, adult: Secondary | ICD-10-CM

## 2023-01-19 DIAGNOSIS — Z01419 Encounter for gynecological examination (general) (routine) without abnormal findings: Secondary | ICD-10-CM | POA: Diagnosis not present

## 2023-01-19 DIAGNOSIS — Z3041 Encounter for surveillance of contraceptive pills: Secondary | ICD-10-CM

## 2023-01-19 MED ORDER — NORETHINDRONE 0.35 MG PO TABS: 1.0000 | ORAL_TABLET | Freq: Every day | ORAL | 3 refills | Status: DC

## 2023-01-19 NOTE — Progress Notes (Signed)
32 y.o. G0P0000 Married [place] or [place] American female here for annual exam.  H/o PCOS and type 2 diabetes.  On micronor.  Cycles are fairly regular.    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    Patient's last menstrual period was 12/27/2022 (approximate).          Sexually active: Yes.    The current method of family planning is oral progesterone-only contraceptive.    Smoker:  no  Health Maintenance: Pap:  02/2021 with neg HR HPV (in care everywhere) History of abnormal Pap:  no MMG:  guidelines reviewed Colonoscopy:  guidelines reviewed Screening Labs: done 10/2022   reports that she has never smoked. She has never been exposed to tobacco smoke. She has never used smokeless tobacco. She reports that she does not drink alcohol and does not use drugs.  Past Medical History:  Diagnosis Date   Anxiety    no meds   Cholelithiasis    Hypertension    Irritable bowel syndrome (IBS) 2018   PCOS (polycystic ovarian syndrome)     Past Surgical History:  Procedure Laterality Date   CHOLECYSTECTOMY N/A 06/21/2017   Procedure: LAPAROSCOPIC CHOLECYSTECTOMY;  Surgeon: Ancil Linsey, MD;  Location: [facility] ORS;  Service: General;  Laterality: N/A;   NO PAST SURGERIES      Current Outpatient Medications  Medication Sig Dispense Refill   lisinopril-hydrochlorothiazide (ZESTORETIC) 10-12.5 MG tablet Take 1 tablet by mouth daily. 90 tablet 3   ondansetron (ZOFRAN) 4 MG tablet Take 1 tablet (4 mg total) by mouth every 8 (eight) hours as needed for nausea or vomiting. 30 tablet 3   rosuvastatin (CRESTOR) 10 MG tablet Take 1 tablet by mouth at bedtime. Start with every other night dosing and increase as directed. 30 tablet 3   Semaglutide 14 MG TABS Take 1 tablet (14 mg total) by mouth daily. 90 tablet 3   tirzepatide (MOUNJARO) 2.5 MG/0.5ML Pen Inject 2.5 mg into the skin once a week. 2 mL 0   Vitamin D, Ergocalciferol, (DRISDOL) 1.25 MG (50000 UNIT) CAPS  capsule TAKE 1 CAPSULE (50,000 UNITS TOTAL) BY MOUTH EVERY 7 (SEVEN) DAYS. TAKE FOR 12 TOTAL DOSES(WEEKS) 12 capsule 0   norethindrone (MICRONOR) 0.35 MG tablet Take 1 tablet (0.35 mg total) by mouth daily. 84 tablet 3   No current facility-administered medications for this visit.    Family History  Problem Relation Age of Onset   Hypertension Mother    Cancer Maternal Grandmother    Hypertension Maternal Grandmother    Colon cancer Maternal Grandmother 66   Heart disease Maternal Grandfather    Hypertension Maternal Grandfather     ROS: Constitutional: negative Genitourinary:negative  Exam:   BP 130/86 (BP Location: Right Arm, Patient Position: Sitting, Cuff Size: Large)   Pulse 93   Ht 5\' 4"  (1.626 m) Comment: Reported  Wt 267 lb 6.4 oz (121.3 kg)   LMP 12/27/2022 (Approximate)   BMI 45.90 kg/m   Height: 5\' 4"  (162.6 cm) (Reported)  General appearance: alert, cooperative and appears stated age Head: Normocephalic, without obvious abnormality, atraumatic Neck: no adenopathy, supple, symmetrical, trachea midline and thyroid normal to inspection and palpation Lungs: clear to auscultation bilaterally Breasts: normal appearance, no masses or tenderness Heart: regular rate and rhythm Abdomen: soft, non-tender; bowel sounds normal; no masses,  no organomegaly Extremities: extremities normal, atraumatic, no cyanosis or edema Skin: Skin color, texture, turgor normal. No rashes or lesions Lymph nodes: Cervical, supraclavicular, and  axillary nodes normal. No abnormal inguinal nodes palpated Neurologic: Grossly normal   Pelvic: External genitalia:  no lesions              Urethra:  normal appearing urethra with no masses, tenderness or lesions              Bartholins and Skenes: normal                 Vagina: normal appearing vagina with normal color and no discharge, no lesions              Cervix: no lesions              Pap taken: No. Bimanual Exam:  Uterus:  normal size,  contour, position, consistency, mobility, non-tender              Adnexa: normal adnexa and no mass, fullness, tenderness               Rectovaginal: Confirms               Anus:  normal sphincter tone, no lesions  Chaperone, Ina Homes, CMA, was present for exam.  Assessment/Plan: 1. Well woman exam with routine gynecological exam - Pap smear 02/2021 in care everywhere.  Will repeat next year. - Mammogram guidelines reviewed. - Colonoscopy guidelines reviewed. - lab work done with PCP, [facility] Dec Early - vaccines reviewed/updated  2. PCOS (polycystic ovarian syndrome)  3. Type 2 diabetes mellitus with hyperglycemia, without long-term current use of insulin - last hba1c was 10/2022  4. BMI 45.0-49.9, adult  5. Encounter for surveillance of contraceptive pills - norethindrone (MICRONOR) 0.35 MG tablet; Take 1 tablet (0.35 mg total) by mouth daily.  Dispense: 84 tablet; Refill: 3

## 2023-02-02 NOTE — Telephone Encounter (Signed)
I don't know if you tried another P.A. but I tried to do another & included DM as diagnosis but it asked for copy of labs proving this,  I do not see an A1c of greater than 6.5. any suggestions?

## 2023-02-03 ENCOUNTER — Other Ambulatory Visit: Payer: Self-pay | Admitting: Nurse Practitioner

## 2023-02-03 DIAGNOSIS — E1169 Type 2 diabetes mellitus with other specified complication: Secondary | ICD-10-CM

## 2023-02-16 ENCOUNTER — Telehealth: Payer: Self-pay | Admitting: Nurse Practitioner

## 2023-02-16 NOTE — Telephone Encounter (Signed)
Pt called back & states she didn't have another doctor that diagnosed her with diabetes,  states she hasn't had any other labs drawn anywhere else.  States really appreciates any help with getting her meds.

## 2023-02-16 NOTE — Telephone Encounter (Signed)
Left message for pt, want to see if she can get documentation from previous doctor of labs

## 2023-04-25 ENCOUNTER — Encounter: Payer: BC Managed Care – PPO | Admitting: Nurse Practitioner

## 2023-05-11 ENCOUNTER — Encounter: Payer: BC Managed Care – PPO | Admitting: Nurse Practitioner

## 2023-05-25 ENCOUNTER — Encounter: Payer: Self-pay | Admitting: Nurse Practitioner

## 2023-05-25 ENCOUNTER — Ambulatory Visit: Payer: BC Managed Care – PPO | Admitting: Nurse Practitioner

## 2023-05-25 VITALS — BP 120/78 | HR 95 | Wt 272.0 lb

## 2023-05-25 DIAGNOSIS — E559 Vitamin D deficiency, unspecified: Secondary | ICD-10-CM

## 2023-05-25 DIAGNOSIS — R0989 Other specified symptoms and signs involving the circulatory and respiratory systems: Secondary | ICD-10-CM | POA: Diagnosis not present

## 2023-05-25 DIAGNOSIS — E282 Polycystic ovarian syndrome: Secondary | ICD-10-CM | POA: Diagnosis not present

## 2023-05-25 DIAGNOSIS — R7303 Prediabetes: Secondary | ICD-10-CM | POA: Diagnosis not present

## 2023-05-25 DIAGNOSIS — I10 Essential (primary) hypertension: Secondary | ICD-10-CM

## 2023-05-25 DIAGNOSIS — Z6841 Body Mass Index (BMI) 40.0 and over, adult: Secondary | ICD-10-CM | POA: Diagnosis not present

## 2023-05-25 LAB — POCT UA - MICROALBUMIN
Albumin/Creatinine Ratio, Urine, POC: 3.7
Creatinine, POC: 500 mg/dL
Microalbumin Ur, POC: 18.4 mg/L

## 2023-05-25 NOTE — Patient Instructions (Signed)
WEIGHT LOSS PLANNING  For best management of weight, it is vital to balance intake versus output. This means the number of calories burned per day must be less than the calories you take in with food and drink.   I recommend trying to follow a diet with the following: Calories: 1200-1500 calories per day Carbohydrates: 150-180 grams of carbohydrates per day  Why: Gives your body enough "quick fuel" for cells to maintain normal function without sending them into starvation mode.  Protein: At least 90 grams of protein per day- 30 grams with each meal Why: Protein takes longer and uses more energy than carbohydrates to break down for fuel. The carbohydrates in your meals serves as quick energy sources and proteins help use some of that extra quick energy to break down to produce long term energy. This helps you not feel hungry as quickly and protein breakdown burns calories.  Water: Drink AT LEAST 64 ounces of water per day  Why: Water is essential to healthy metabolism. Water helps to fill the stomach and keep you fuller longer. Water is required for healthy digestion and filtering of waste in the body.  Fat: Limit fats in your diet- when choosing fats, choose foods with lower fats content such as lean meats (chicken, fish, turkey).  Why: Increased fat intake leads to storage "for later". Once you burn your carbohydrate energy, your body goes into fat and protein breakdown mode to help you loose weight.  Cholesterol: Fats and oils that are LIQUID at room temperature are best. Choose vegetable oils (olive oil, avocado oil, nuts). Avoid fats that are SOLID at room temperature (animal fats, processed meats). Healthy fats are often found in whole grains, beans, nuts, seeds, and berries.  Why: Elevated cholesterol levels lead to build up of cholesterol on the inside of your blood vessels. This will eventually cause the blood vessels to become hard and can lead to high blood pressure and damage to your  organs. When the blood flow is reduced, but the pressure is high from cholesterol buildup, parts of the cholesterol can break off and form clots that can go to the brain or heart leading to a stroke or heart attack.  Fiber: Increase amount of SOLUBLE the fiber in your diet. This helps to fill you up, lowers cholesterol, and helps with digestion. Some foods high in soluble fiber are oats, peas, beans, apples, carrots, barley, and citrus fruits.   Why: Fiber fills you up, helps remove excess cholesterol, and aids in healthy digestion which are all very important in weight management.   I recommend the following as a minimum activity routine: Purposeful walk or other physical activity at least 20 minutes every single day. This means purposefully taking a walk, jog, bike, swim, treadmill, elliptical, dance, etc.  This activity should be ABOVE your normal daily activities, such as walking at work. Goal exercise should be at least 150 minutes a week- work your way up to this.   Heart Rate: Your maximum exercise heart rate should be 220 - Your Age in Years. When exercising, get your heart rate up, but avoid going over the maximum targeted heart rate.  60-70% of your maximum heart rate is where you tend to burn the most fat. To find this number:  220 - Age In Years= Max HR  Max HR x 0.6 (or 0.7) = Fat Burning HR The Fat Burning HR is your goal heart rate while working out to burn the most fat.  NEVER exercise to   the point your feel lightheaded, weak, nauseated, dizzy. If you experience ANY of these symptoms- STOP exercise! Allow yourself to cool down and your heart rate to come down. Then restart slower next time.  If at ANY TIME you feel chest pain or chest pressure during exercise, STOP IMMEDIATELY and seek medical attention.   

## 2023-05-25 NOTE — Progress Notes (Signed)
Shawna Clamp, DNP, AGNP-c [facility]  [address] [place], [place] [address] [phone]  ESTABLISHED PATIENT- Chronic Health and/or Follow-Up Visit  Blood pressure 120/78, pulse 95, weight 272 lb (123.4 kg), last menstrual period 04/24/2023.    Briana Barber is a 32 y.o. year old female presenting today for evaluation and management of chronic conditions.   Pre Diabetes She denies any symptoms of increased hunger, thirst, or urination. She tells me she has been walking more with a new job on a bigger campus (she is now at [employer]). She is monitoring her diet. We have tried to get approval for GLP-1 medications in the setting of increasing A1c, HLD, HTN, PCOS and obesity, but have been unsuccessful in coverage. She has not tolerated metformin in the past.   HTN She has been taking her blood pressure medication daily. Her blood pressures have been stable. She is not having any chest pain, shortness of breath, dizziness, orthopnea, or LE edema  HLD She has been taking her cholesterol medication intermittently as she has difficulty remembering at bedtime.   All ROS negative with exception of what is listed above.   PHYSICAL EXAM Physical Exam Vitals and nursing note reviewed.  Constitutional:      General: She is not in acute distress.    Appearance: Normal appearance. She is obese.  HENT:     Head: Normocephalic.  Eyes:     Conjunctiva/sclera: Conjunctivae normal.  Neck:     Thyroid: Thyromegaly present. No thyroid tenderness.     Vascular: No carotid bruit.  Cardiovascular:     Rate and Rhythm: Normal rate and regular rhythm.     Pulses: Normal pulses.     Heart sounds: Normal heart sounds. No murmur heard. Pulmonary:     Effort: Pulmonary effort is normal.     Breath sounds: Normal breath sounds.  Musculoskeletal:     Right lower leg: No edema.     Left lower leg: No edema.  Skin:    General: Skin is warm and dry.     Capillary  Refill: Capillary refill takes less than 2 seconds.  Neurological:     General: No focal deficit present.     Mental Status: She is alert and oriented to person, place, and time.     Motor: No weakness.  Psychiatric:        Mood and Affect: Mood normal.     PLAN Problem List Items Addressed This Visit     BMI 45.0-49.9, adult (HCC)    Chronic. Working on diet and exercise to help maintain control. She is walking much more with her new job, which is great. I feel that GLP-1 would still be the best option for her to help with weight, pre-dm, hld, htn, and PCOS, but we have not been able to get approval. Continue with diet and exercise management.       Relevant Orders   CMP14+EGFR (Completed)   Hemoglobin A1c (Completed)   POCT UA - Microalbumin (Completed)   VITAMIN D 25 Hydroxy (Vit-D Deficiency, Fractures) (Completed)   Primary hypertension    Chronic.  Currently managed with lisinopril- hydrochlorothiazide. No alarm symptoms present at this time.  Goal blood pressure less than less than 130/80.  Refills have not been provided today.  Labs today to check electrolytes and kidney function.  Recommend current treatment plan is effective, no change in therapy, orders and follow up as documented in EpicCare, reviewed diet, exercise and weight control. Currently is not  followed with cardiology. We will make changes to plan of care as necessary based on lab results. Plan to follow-up in 6months.       Relevant Orders   CMP14+EGFR (Completed)   Hemoglobin A1c (Completed)   POCT UA - Microalbumin (Completed)   VITAMIN D 25 Hydroxy (Vit-D Deficiency, Fractures) (Completed)   Pre-diabetes    Chronic. Currently not on any medications. Unable to get approval for GLP-1, which would be most beneficial given her co-morbidities. Will review labs today. Continue to work on diet and exercise.         PCOS (polycystic ovarian syndrome)    Chronic. No changes at this time. Continue to monitor.        Relevant Orders   CMP14+EGFR (Completed)   Hemoglobin A1c (Completed)   POCT UA - Microalbumin (Completed)   VITAMIN D 25 Hydroxy (Vit-D Deficiency, Fractures) (Completed)   Other Visit Diagnoses     Prediabetes    -  Primary   Relevant Orders   CMP14+EGFR (Completed)   Hemoglobin A1c (Completed)   POCT UA - Microalbumin (Completed)   VITAMIN D 25 Hydroxy (Vit-D Deficiency, Fractures) (Completed)   Vitamin D deficiency       Relevant Orders   CMP14+EGFR (Completed)   Hemoglobin A1c (Completed)   POCT UA - Microalbumin (Completed)   VITAMIN D 25 Hydroxy (Vit-D Deficiency, Fractures) (Completed)   Throat fullness       Relevant Orders   TSH (Completed)       Return in about 6 months (around 11/25/2023) for Med Management 30.  Time: 33 minutes, >50% spent counseling, care coordination, chart review, and documentation.   Shawna Clamp, DNP, AGNP-c

## 2023-05-26 LAB — CMP14+EGFR
ALT: 24 IU/L (ref 0–32)
AST: 21 IU/L (ref 0–40)
Albumin: 4.1 g/dL (ref 3.9–4.9)
Alkaline Phosphatase: 63 IU/L (ref 44–121)
BUN/Creatinine Ratio: 16 (ref 9–23)
BUN: 15 mg/dL (ref 6–20)
Bilirubin Total: 0.3 mg/dL (ref 0.0–1.2)
CO2: 24 mmol/L (ref 20–29)
Calcium: 9.3 mg/dL (ref 8.7–10.2)
Chloride: 102 mmol/L (ref 96–106)
Creatinine, Ser: 0.95 mg/dL (ref 0.57–1.00)
Globulin, Total: 2.9 g/dL (ref 1.5–4.5)
Glucose: 105 mg/dL — ABNORMAL HIGH (ref 70–99)
Potassium: 4.1 mmol/L (ref 3.5–5.2)
Sodium: 140 mmol/L (ref 134–144)
Total Protein: 7 g/dL (ref 6.0–8.5)
eGFR: 81 mL/min/{1.73_m2} (ref >59)

## 2023-05-26 LAB — HEMOGLOBIN A1C
Est. average glucose Bld gHb Est-mCnc: 131 mg/dL
Hgb A1c MFr Bld: 6.2 % — ABNORMAL HIGH (ref 4.8–5.6)

## 2023-05-26 LAB — VITAMIN D 25 HYDROXY (VIT D DEFICIENCY, FRACTURES): Vit D, 25-Hydroxy: 25.3 ng/mL — ABNORMAL LOW (ref 30.0–100.0)

## 2023-05-26 LAB — TSH: TSH: 1.75 u[IU]/mL (ref 0.450–4.500)

## 2023-06-06 NOTE — Assessment & Plan Note (Signed)
Chronic. Working on diet and exercise to help maintain control. She is walking much more with her new job, which is great. I feel that GLP-1 would still be the best option for her to help with weight, pre-dm, hld, htn, and PCOS, but we have not been able to get approval. Continue with diet and exercise management.

## 2023-06-06 NOTE — Assessment & Plan Note (Signed)
Chronic. No changes at this time. Continue to monitor.

## 2023-06-06 NOTE — Assessment & Plan Note (Signed)
Chronic. Currently not on any medications. Unable to get approval for GLP-1, which would be most beneficial given her co-morbidities. Will review labs today. Continue to work on diet and exercise.

## 2023-06-06 NOTE — Assessment & Plan Note (Signed)
Chronic.  Currently managed with lisinopril- hydrochlorothiazide. No alarm symptoms present at this time.  Goal blood pressure less than less than 130/80.  Refills have not been provided today.  Labs today to check electrolytes and kidney function.  Recommend current treatment plan is effective, no change in therapy, orders and follow up as documented in EpicCare, reviewed diet, exercise and weight control. Currently is not followed with cardiology. We will make changes to plan of care as necessary based on lab results. Plan to follow-up in 6months.

## 2023-06-07 ENCOUNTER — Other Ambulatory Visit: Payer: Self-pay | Admitting: Nurse Practitioner

## 2023-06-07 DIAGNOSIS — E559 Vitamin D deficiency, unspecified: Secondary | ICD-10-CM

## 2023-06-07 MED ORDER — VITAMIN D (ERGOCALCIFEROL) 1.25 MG (50000 UNIT) PO CAPS: 50000.0000 [IU] | ORAL_CAPSULE | ORAL | 3 refills | Status: DC

## 2023-07-28 ENCOUNTER — Telehealth: Payer: Self-pay | Admitting: Nurse Practitioner

## 2023-07-28 DIAGNOSIS — Z6841 Body Mass Index (BMI) 40.0 and over, adult: Secondary | ICD-10-CM

## 2023-07-28 DIAGNOSIS — R7303 Prediabetes: Secondary | ICD-10-CM

## 2023-07-28 DIAGNOSIS — E282 Polycystic ovarian syndrome: Secondary | ICD-10-CM

## 2023-07-28 DIAGNOSIS — I10 Essential (primary) hypertension: Secondary | ICD-10-CM

## 2023-07-28 NOTE — Telephone Encounter (Signed)
Huntley Dec, I reviewed this chart & Doesn't look like we have tried to get [facility] approved, since pt doesn't have diabetes, will you send in [facility] & we will try that as a final attempt to get a GLP1 approved for her

## 2023-08-02 MED ORDER — WEGOVY 0.25 MG/0.5ML ~~LOC~~ SOAJ
0.2500 mg | SUBCUTANEOUS | 0 refills | Status: DC
Start: 2023-08-02 — End: 2024-01-23

## 2023-08-02 NOTE — Telephone Encounter (Signed)
Sent!

## 2023-08-25 ENCOUNTER — Telehealth: Payer: Self-pay

## 2023-08-25 ENCOUNTER — Other Ambulatory Visit: Payer: Self-pay

## 2023-08-25 DIAGNOSIS — E1169 Type 2 diabetes mellitus with other specified complication: Secondary | ICD-10-CM

## 2023-08-25 MED ORDER — ROSUVASTATIN CALCIUM 10 MG PO TABS
ORAL_TABLET | ORAL | 1 refills | Status: DC
Start: 2023-08-25 — End: 2024-01-23

## 2023-08-25 NOTE — Telephone Encounter (Signed)
Refill request for rosuvastatin calcium 10 mg

## 2023-09-09 NOTE — Telephone Encounter (Signed)
Med sent.

## 2023-12-07 ENCOUNTER — Encounter: Payer: BC Managed Care – PPO | Admitting: Nurse Practitioner

## 2023-12-12 ENCOUNTER — Other Ambulatory Visit: Payer: Self-pay | Admitting: Nurse Practitioner

## 2023-12-12 DIAGNOSIS — I10 Essential (primary) hypertension: Secondary | ICD-10-CM

## 2023-12-13 ENCOUNTER — Encounter: Payer: BC Managed Care – PPO | Admitting: Nurse Practitioner

## 2023-12-29 ENCOUNTER — Encounter: Payer: BC Managed Care – PPO | Admitting: Nurse Practitioner

## 2024-01-09 ENCOUNTER — Other Ambulatory Visit: Payer: Self-pay | Admitting: Nurse Practitioner

## 2024-01-09 DIAGNOSIS — I10 Essential (primary) hypertension: Secondary | ICD-10-CM

## 2024-01-23 ENCOUNTER — Encounter: Payer: Self-pay | Admitting: Nurse Practitioner

## 2024-01-23 ENCOUNTER — Ambulatory Visit: Admitting: Nurse Practitioner

## 2024-01-23 VITALS — BP 120/78 | HR 94 | Wt 273.6 lb

## 2024-01-23 DIAGNOSIS — E282 Polycystic ovarian syndrome: Secondary | ICD-10-CM

## 2024-01-23 DIAGNOSIS — R7303 Prediabetes: Secondary | ICD-10-CM

## 2024-01-23 DIAGNOSIS — E559 Vitamin D deficiency, unspecified: Secondary | ICD-10-CM

## 2024-01-23 DIAGNOSIS — Z3041 Encounter for surveillance of contraceptive pills: Secondary | ICD-10-CM

## 2024-01-23 DIAGNOSIS — Z6841 Body Mass Index (BMI) 40.0 and over, adult: Secondary | ICD-10-CM

## 2024-01-23 DIAGNOSIS — I10 Essential (primary) hypertension: Secondary | ICD-10-CM

## 2024-01-23 DIAGNOSIS — E782 Mixed hyperlipidemia: Secondary | ICD-10-CM

## 2024-01-23 MED ORDER — ROSUVASTATIN CALCIUM 10 MG PO TABS: ORAL_TABLET | ORAL | 1 refills | Status: AC

## 2024-01-23 MED ORDER — NORETHINDRONE 0.35 MG PO TABS: 1.0000 | ORAL_TABLET | Freq: Every day | ORAL | 3 refills | Status: AC

## 2024-01-23 MED ORDER — VITAMIN D (ERGOCALCIFEROL) 1.25 MG (50000 UNIT) PO CAPS: 50000.0000 [IU] | ORAL_CAPSULE | ORAL | 3 refills | Status: AC

## 2024-01-23 MED ORDER — LISINOPRIL-HYDROCHLOROTHIAZIDE 10-12.5 MG PO TABS: 1.0000 | ORAL_TABLET | Freq: Every day | ORAL | 0 refills | Status: DC

## 2024-01-23 NOTE — Assessment & Plan Note (Signed)
 Managed with rosuvastatin (Crestor). No side effects reported. - Continue rosuvastatin (Crestor).

## 2024-01-23 NOTE — Progress Notes (Signed)
 Dell Fennel, DNP, AGNP-c [facility]  [address] [place], [place] [address] [phone]  ESTABLISHED PATIENT- Chronic Health and/or Follow-Up Visit  Blood pressure 120/78, pulse 94, weight 273 lb 9.6 oz (124.1 kg), last menstrual period 12/04/2023.    Briana Barber is a 33 y.o. year old female presenting today for evaluation and management of chronic conditions.   Discussed the use of AI scribe software for clinical note transcription with the patient, who gave verbal consent to proceed.   History of Present Illness Briana Barber is a 33 year old female with hypertension who presents for a follow-up on blood pressure and weight management.  She has not started on Wegovy due to its high cost, approximately fifteen hundred dollars, which is unaffordable for her. She has not made any recent changes to her diet or exercise routine but is interested in purchasing an electric tricycle to facilitate physical activity, as she lives close to her workplace but not within walking distance.  She has not been checking her blood pressure at home but takes her medication daily, although she occasionally forgets. She is currently taking lisinopril and hydrochlorothiazide for her blood pressure and rosuvastatin (Crestor) without any side effects. She also takes norethindrone and needs a refill. No chest pain, palpitations, dizziness, headaches, or vision changes. She feels it might be time to visit the eye doctor for new glasses as she notices some difficulty with her vision, particularly when switching focus from her phone to the television.  She has experienced multiple episodes of flu-like symptoms over the past few months, occurring around Christmas, after New Year's, and in March. She did not receive a flu shot this year and wonders if this might have contributed to her frequent illnesses. She has been taking COVID tests during these episodes, all of which were negative.  All  ROS negative with exception of what is listed above.   PHYSICAL EXAM Physical Exam Vitals and nursing note reviewed.  Constitutional:      Appearance: Normal appearance.  HENT:     Head: Normocephalic.  Eyes:     Conjunctiva/sclera: Conjunctivae normal.     Pupils: Pupils are equal, round, and reactive to light.  Neck:     Vascular: No carotid bruit.  Cardiovascular:     Rate and Rhythm: Normal rate and regular rhythm.     Pulses: Normal pulses.     Heart sounds: Normal heart sounds.  Pulmonary:     Effort: Pulmonary effort is normal.     Breath sounds: Normal breath sounds.  Abdominal:     General: Bowel sounds are normal. There is no distension.     Palpations: Abdomen is soft.     Tenderness: There is no abdominal tenderness. There is no guarding.  Musculoskeletal:        General: Normal range of motion.     Cervical back: Normal range of motion.     Right lower leg: No edema.     Left lower leg: No edema.  Lymphadenopathy:     Cervical: No cervical adenopathy.  Skin:    General: Skin is warm.  Neurological:     General: No focal deficit present.     Mental Status: She is alert and oriented to person, place, and time.     Motor: No weakness.  Psychiatric:        Mood and Affect: Mood normal.        Behavior: Behavior normal.      PLAN Problem List Items  Addressed This Visit     BMI 45.0-49.9, adult [facility]) - Primary   Not on weight loss program or medication due to cost issues with [facility]. No exercise or dietary changes. Discussed AI tools for diet planning and calorie intake calculation. Considered Ozempic if blood sugar levels increase and insurance coverage is favorable. - Provide calorie intake recommendations in after-visit summary. - Encourage exercise options, such as cycling. - Consider Ozempic if blood sugar levels increase and insurance coverage is favorable.      Relevant Orders   Hemoglobin A1c   CBC with Differential/Platelet   Comprehensive  metabolic panel with GFR   Lipid panel   VITAMIN D 25 Hydroxy (Vit-D Deficiency, Fractures)   Primary hypertension   Blood pressure is well-controlled with lisinopril and hydrochlorothiazide. No side effects reported. No symptoms such as chest pain, palpitations, dizziness, headaches, or vision changes. - Continue lisinopril and hydrochlorothiazide. - Encourage home blood pressure monitoring.      Relevant Medications   lisinopril-hydrochlorothiazide (ZESTORETIC) 10-12.5 MG tablet   rosuvastatin (CRESTOR) 10 MG tablet   Other Relevant Orders   Hemoglobin A1c   CBC with Differential/Platelet   Comprehensive metabolic panel with GFR   Lipid panel   Pre-diabetes   Currently not taking medication. She has been working on her diet some, but not exercising regularly at this time. Discussed dietary modifications and provided with a sample menu for two weeks. Will recheck labs to make sure these are appropriate. Encouraged to work on increased exercise to help with management.       Relevant Orders   Hemoglobin A1c   CBC with Differential/Platelet   Comprehensive metabolic panel with GFR   Lipid panel   PCOS (polycystic ovarian syndrome)   Refill on norethindrone. Typically prescribed by Dr. Annabell Key, but running out. Last visit had to be rescheduled and she plans to do this this week.       Mixed hyperlipidemia   Managed with rosuvastatin (Crestor). No side effects reported. - Continue rosuvastatin (Crestor).      Relevant Medications   lisinopril-hydrochlorothiazide (ZESTORETIC) 10-12.5 MG tablet   rosuvastatin (CRESTOR) 10 MG tablet   Other Relevant Orders   Hemoglobin A1c   CBC with Differential/Platelet   Comprehensive metabolic panel with GFR   Lipid panel   Vitamin D deficiency   Recheck levels. Refills of high dose Vitamin D sent.       Relevant Medications   Vitamin D, Ergocalciferol, (DRISDOL) 1.25 MG (50000 UNIT) CAPS capsule   Other Relevant Orders   VITAMIN D 25  Hydroxy (Vit-D Deficiency, Fractures)   Other Visit Diagnoses       Encounter for surveillance of contraceptive pills       Relevant Medications   norethindrone (MICRONOR) 0.35 MG tablet       Return in about 6 months (around 07/24/2024) for CPE.  Dell Fennel, DNP, AGNP-c

## 2024-01-23 NOTE — Assessment & Plan Note (Signed)
 Refill on norethindrone. Typically prescribed by Dr. Annabell Key, but running out. Last visit had to be rescheduled and she plans to do this this week.

## 2024-01-23 NOTE — Assessment & Plan Note (Signed)
 Recheck levels. Refills of high dose Vitamin D sent.

## 2024-01-23 NOTE — Assessment & Plan Note (Signed)
 Not on weight loss program or medication due to cost issues with [facility]. No exercise or dietary changes. Discussed AI tools for diet planning and calorie intake calculation. Considered Ozempic if blood sugar levels increase and insurance coverage is favorable. - Provide calorie intake recommendations in after-visit summary. - Encourage exercise options, such as cycling. - Consider Ozempic if blood sugar levels increase and insurance coverage is favorable.

## 2024-01-23 NOTE — Assessment & Plan Note (Signed)
 Currently not taking medication. She has been working on her diet some, but not exercising regularly at this time. Discussed dietary modifications and provided with a sample menu for two weeks. Will recheck labs to make sure these are appropriate. Encouraged to work on increased exercise to help with management.

## 2024-01-23 NOTE — Assessment & Plan Note (Signed)
 Blood pressure is well-controlled with lisinopril and hydrochlorothiazide. No side effects reported. No symptoms such as chest pain, palpitations, dizziness, headaches, or vision changes. - Continue lisinopril and hydrochlorothiazide. - Encourage home blood pressure monitoring.

## 2024-01-23 NOTE — Patient Instructions (Addendum)
 You look very good today!! I will let you know what your labs show and if we need to make any changes I will let you know.   Keep working on increasing your exercise and try to get about 150 minutes of activity. I have included diet and exercise information below. You want to get about 1800 calories a day with walking 3-4 days a week for 30 minutes to lose about 1-1.5 lb a week. This is a healthy option.    WEIGHT LOSS PLANNING Your progress today shows:     01/23/2024    9:25 AM 05/25/2023    9:16 AM 01/19/2023    9:46 AM  Vitals with BMI  Height   5\' 4"   Weight 273 lbs 10 oz 272 lbs 267 lbs 6 oz  BMI   45.88  Systolic 120 120 951  Diastolic 78 78 86  Pulse 94 95 93    For best management of weight, it is vital to balance intake versus output. This means the number of calories burned per day must be less than the calories you take in with food and drink.   I recommend trying to follow a diet with the following: Calories: 1800 a day Carbohydrates: 150-180 grams of carbohydrates per day  Why: Gives your body enough "quick fuel" for cells to maintain normal function without sending them into starvation mode.  Protein: At least 90 grams of protein per day- 30 grams with each meal Why: Protein takes longer and uses more energy than carbohydrates to break down for fuel. The carbohydrates in your meals serves as quick energy sources and proteins help use some of that extra quick energy to break down to produce long term energy. This helps you not feel hungry as quickly and protein breakdown burns calories.  Water: Drink AT LEAST 64 ounces of water per day  Why: Water is essential to healthy metabolism. Water helps to fill the stomach and keep you fuller longer. Water is required for healthy digestion and filtering of waste in the body.  Fat: Limit fats in your diet- when choosing fats, choose foods with lower fats content such as lean meats (chicken, fish, [place]).  Why: Increased fat intake  leads to storage "for later". Once you burn your carbohydrate energy, your body goes into fat and protein breakdown mode to help you loose weight.  Cholesterol: Fats and oils that are LIQUID at room temperature are best. Choose vegetable oils (olive oil, avocado oil, nuts). Avoid fats that are SOLID at room temperature (animal fats, processed meats). Healthy fats are often found in whole grains, beans, nuts, seeds, and berries.  Why: Elevated cholesterol levels lead to build up of cholesterol on the inside of your blood vessels. This will eventually cause the blood vessels to become hard and can lead to high blood pressure and damage to your organs. When the blood flow is reduced, but the pressure is high from cholesterol buildup, parts of the cholesterol can break off and form clots that can go to the brain or heart leading to a stroke or heart attack.  Fiber: Increase amount of SOLUBLE the fiber in your diet. This helps to fill you up, lowers cholesterol, and helps with digestion. Some foods high in soluble fiber are oats, peas, beans, apples, carrots, barley, and citrus fruits.   Why: Fiber fills you up, helps remove excess cholesterol, and aids in healthy digestion which are all very important in weight management.   I recommend the following as  a minimum activity routine: Purposeful walk or other physical activity at least 20 minutes every single day. This means purposefully taking a walk, jog, bike, swim, treadmill, elliptical, dance, etc.  This activity should be ABOVE your normal daily activities, such as walking at work. Goal exercise should be at least 150 minutes a week- work your way up to this.   Heart Rate: Your maximum exercise heart rate should be 220 - Your Age in Years. When exercising, get your heart rate up, but avoid going over the maximum targeted heart rate.  60-70% of your maximum heart rate is where you tend to burn the most fat. To find this number:  220 - Age In Years= Max  HR  Max HR x 0.6 (or 0.7) = Fat Burning HR The Fat Burning HR is your goal heart rate while working out to burn the most fat.  NEVER exercise to the point your feel lightheaded, weak, nauseated, dizzy. If you experience ANY of these symptoms- STOP exercise! Allow yourself to cool down and your heart rate to come down. Then restart slower next time.  If at ANY TIME you feel chest pain or chest pressure during exercise, STOP IMMEDIATELY and seek medical attention.

## 2024-01-24 LAB — LIPID PANEL
Chol/HDL Ratio: 3.2 ratio (ref 0.0–4.4)
Cholesterol, Total: 126 mg/dL (ref 100–199)
HDL: 40 mg/dL (ref >39)
LDL Chol Calc (NIH): 76 mg/dL (ref 0–99)
Triglycerides: 42 mg/dL (ref 0–149)
VLDL Cholesterol Cal: 10 mg/dL (ref 5–40)

## 2024-01-24 LAB — COMPREHENSIVE METABOLIC PANEL WITH GFR
ALT: 24 IU/L (ref 0–32)
AST: 15 IU/L (ref 0–40)
Albumin: 4.2 g/dL (ref 3.9–4.9)
Alkaline Phosphatase: 64 IU/L (ref 44–121)
BUN/Creatinine Ratio: 20 (ref 9–23)
BUN: 17 mg/dL (ref 6–20)
Bilirubin Total: <0.2 mg/dL (ref 0.0–1.2)
CO2: 22 mmol/L (ref 20–29)
Calcium: 9.6 mg/dL (ref 8.7–10.2)
Chloride: 100 mmol/L (ref 96–106)
Creatinine, Ser: 0.85 mg/dL (ref 0.57–1.00)
Globulin, Total: 3.1 g/dL (ref 1.5–4.5)
Glucose: 102 mg/dL — ABNORMAL HIGH (ref 70–99)
Potassium: 4.3 mmol/L (ref 3.5–5.2)
Sodium: 138 mmol/L (ref 134–144)
Total Protein: 7.3 g/dL (ref 6.0–8.5)
eGFR: 93 mL/min/{1.73_m2} (ref >59)

## 2024-01-24 LAB — CBC WITH DIFFERENTIAL/PLATELET
Basophils Absolute: 0 10*3/uL (ref 0.0–0.2)
Basos: 1 %
EOS (ABSOLUTE): 0.2 10*3/uL (ref 0.0–0.4)
Eos: 2 %
Hematocrit: 38.8 % (ref 34.0–46.6)
Hemoglobin: 13 g/dL (ref 11.1–15.9)
Immature Grans (Abs): 0 10*3/uL (ref 0.0–0.1)
Immature Granulocytes: 0 %
Lymphocytes Absolute: 2.6 10*3/uL (ref 0.7–3.1)
Lymphs: 32 %
MCH: 30.6 pg (ref 26.6–33.0)
MCHC: 33.5 g/dL (ref 31.5–35.7)
MCV: 91 fL (ref 79–97)
Monocytes Absolute: 0.4 10*3/uL (ref 0.1–0.9)
Monocytes: 5 %
Neutrophils Absolute: 5.1 10*3/uL (ref 1.4–7.0)
Neutrophils: 60 %
Platelets: 418 10*3/uL (ref 150–450)
RBC: 4.25 x10E6/uL (ref 3.77–5.28)
RDW: 13.6 % (ref 11.7–15.4)
WBC: 8.3 10*3/uL (ref 3.4–10.8)

## 2024-01-24 LAB — HEMOGLOBIN A1C
Est. average glucose Bld gHb Est-mCnc: 131 mg/dL
Hgb A1c MFr Bld: 6.2 % — ABNORMAL HIGH (ref 4.8–5.6)

## 2024-01-24 LAB — VITAMIN D 25 HYDROXY (VIT D DEFICIENCY, FRACTURES): Vit D, 25-Hydroxy: 51.6 ng/mL (ref 30.0–100.0)

## 2024-01-25 ENCOUNTER — Encounter: Payer: Self-pay | Admitting: Nurse Practitioner

## 2024-01-25 ENCOUNTER — Ambulatory Visit (HOSPITAL_BASED_OUTPATIENT_CLINIC_OR_DEPARTMENT_OTHER): Payer: BC Managed Care – PPO | Admitting: Obstetrics & Gynecology

## 2024-07-18 ENCOUNTER — Other Ambulatory Visit: Payer: Self-pay | Admitting: Nurse Practitioner

## 2024-07-18 DIAGNOSIS — I10 Essential (primary) hypertension: Secondary | ICD-10-CM

## 2024-08-02 ENCOUNTER — Encounter: Admitting: Nurse Practitioner

## 2024-12-20 ENCOUNTER — Encounter: Payer: Self-pay | Admitting: Nurse Practitioner
